# Patient Record
Sex: Female | Born: 1935 | Race: White | Hispanic: No | Marital: Married | Smoking: Never smoker
Health system: Southern US, Community
[De-identification: ages and names within clinical notes are randomized; demographics above are authoritative.]

## PROBLEM LIST (undated history)

## (undated) DIAGNOSIS — G5600 Carpal tunnel syndrome, unspecified upper limb: Secondary | ICD-10-CM

## (undated) DIAGNOSIS — R9389 Abnormal findings on diagnostic imaging of other specified body structures: Secondary | ICD-10-CM

## (undated) DIAGNOSIS — D649 Anemia, unspecified: Secondary | ICD-10-CM

## (undated) DIAGNOSIS — N189 Chronic kidney disease, unspecified: Secondary | ICD-10-CM

## (undated) DIAGNOSIS — Z87898 Personal history of other specified conditions: Secondary | ICD-10-CM

## (undated) DIAGNOSIS — E039 Hypothyroidism, unspecified: Secondary | ICD-10-CM

## (undated) DIAGNOSIS — I4891 Unspecified atrial fibrillation: Secondary | ICD-10-CM

## (undated) DIAGNOSIS — M5030 Other cervical disc degeneration, unspecified cervical region: Secondary | ICD-10-CM

## (undated) DIAGNOSIS — T464X5A Adverse effect of angiotensin-converting-enzyme inhibitors, initial encounter: Secondary | ICD-10-CM

## (undated) DIAGNOSIS — E785 Hyperlipidemia, unspecified: Secondary | ICD-10-CM

## (undated) DIAGNOSIS — I358 Other nonrheumatic aortic valve disorders: Secondary | ICD-10-CM

## (undated) DIAGNOSIS — I495 Sick sinus syndrome: Secondary | ICD-10-CM

## (undated) DIAGNOSIS — X58XXXA Exposure to other specified factors, initial encounter: Secondary | ICD-10-CM

## (undated) DIAGNOSIS — R05 Cough: Secondary | ICD-10-CM

## (undated) DIAGNOSIS — I340 Nonrheumatic mitral (valve) insufficiency: Secondary | ICD-10-CM

## (undated) DIAGNOSIS — R058 Other specified cough: Secondary | ICD-10-CM

## (undated) DIAGNOSIS — I779 Disorder of arteries and arterioles, unspecified: Secondary | ICD-10-CM

## (undated) DIAGNOSIS — R609 Edema, unspecified: Secondary | ICD-10-CM

## (undated) DIAGNOSIS — I739 Peripheral vascular disease, unspecified: Secondary | ICD-10-CM

## (undated) DIAGNOSIS — Z950 Presence of cardiac pacemaker: Secondary | ICD-10-CM

## (undated) DIAGNOSIS — I272 Pulmonary hypertension, unspecified: Secondary | ICD-10-CM

## (undated) DIAGNOSIS — R42 Dizziness and giddiness: Secondary | ICD-10-CM

## (undated) DIAGNOSIS — I10 Essential (primary) hypertension: Secondary | ICD-10-CM

## (undated) DIAGNOSIS — I639 Cerebral infarction, unspecified: Secondary | ICD-10-CM

## (undated) DIAGNOSIS — R55 Syncope and collapse: Secondary | ICD-10-CM

## (undated) DIAGNOSIS — R9430 Abnormal result of cardiovascular function study, unspecified: Secondary | ICD-10-CM

## (undated) DIAGNOSIS — I2510 Atherosclerotic heart disease of native coronary artery without angina pectoris: Secondary | ICD-10-CM

## (undated) DIAGNOSIS — Z7901 Long term (current) use of anticoagulants: Secondary | ICD-10-CM

## (undated) DIAGNOSIS — N2889 Other specified disorders of kidney and ureter: Secondary | ICD-10-CM

## (undated) HISTORY — DX: Unspecified atrial fibrillation: I48.91

## (undated) HISTORY — DX: Pulmonary hypertension, unspecified: I27.20

## (undated) HISTORY — DX: Cough: R05

## (undated) HISTORY — DX: Chronic kidney disease, unspecified: N18.9

## (undated) HISTORY — DX: Hypothyroidism, unspecified: E03.9

## (undated) HISTORY — DX: Personal history of other specified conditions: Z87.898

## (undated) HISTORY — DX: Abnormal findings on diagnostic imaging of other specified body structures: R93.89

## (undated) HISTORY — DX: Long term (current) use of anticoagulants: Z79.01

## (undated) HISTORY — DX: Essential (primary) hypertension: I10

## (undated) HISTORY — DX: Atherosclerotic heart disease of native coronary artery without angina pectoris: I25.10

## (undated) HISTORY — DX: Exposure to other specified factors, initial encounter: X58.XXXA

## (undated) HISTORY — DX: Hyperlipidemia, unspecified: E78.5

## (undated) HISTORY — DX: Presence of cardiac pacemaker: Z95.0

## (undated) HISTORY — DX: Peripheral vascular disease, unspecified: I73.9

## (undated) HISTORY — DX: Other specified disorders of kidney and ureter: N28.89

## (undated) HISTORY — DX: Other nonrheumatic aortic valve disorders: I35.8

## (undated) HISTORY — DX: Other specified cough: R05.8

## (undated) HISTORY — DX: Disorder of arteries and arterioles, unspecified: I77.9

## (undated) HISTORY — DX: Carpal tunnel syndrome, unspecified upper limb: G56.00

## (undated) HISTORY — PX: PACEMAKER INSERTION: SHX728

## (undated) HISTORY — DX: Syncope and collapse: R55

## (undated) HISTORY — DX: Dizziness and giddiness: R42

## (undated) HISTORY — DX: Adverse effect of angiotensin-converting-enzyme inhibitors, initial encounter: T46.4X5A

## (undated) HISTORY — DX: Cerebral infarction, unspecified: I63.9

## (undated) HISTORY — DX: Other cervical disc degeneration, unspecified cervical region: M50.30

## (undated) HISTORY — DX: Edema, unspecified: R60.9

## (undated) HISTORY — DX: Anemia, unspecified: D64.9

## (undated) HISTORY — DX: Sick sinus syndrome: I49.5

## (undated) HISTORY — DX: Abnormal result of cardiovascular function study, unspecified: R94.30

## (undated) HISTORY — DX: Nonrheumatic mitral (valve) insufficiency: I34.0

---

## 2007-10-01 ENCOUNTER — Ambulatory Visit: Payer: Self-pay | Admitting: Cardiology

## 2007-11-26 ENCOUNTER — Ambulatory Visit: Payer: Self-pay

## 2008-01-22 ENCOUNTER — Ambulatory Visit: Payer: Self-pay | Admitting: Cardiology

## 2008-01-31 ENCOUNTER — Ambulatory Visit: Payer: Self-pay | Admitting: Cardiology

## 2008-03-04 ENCOUNTER — Ambulatory Visit: Payer: Self-pay | Admitting: Cardiology

## 2008-04-11 ENCOUNTER — Ambulatory Visit: Payer: Self-pay | Admitting: Cardiology

## 2008-04-15 ENCOUNTER — Ambulatory Visit: Payer: Self-pay | Admitting: Cardiology

## 2008-08-20 ENCOUNTER — Encounter: Payer: Self-pay | Admitting: Cardiology

## 2008-08-23 ENCOUNTER — Encounter: Payer: Self-pay | Admitting: Cardiology

## 2008-08-25 ENCOUNTER — Ambulatory Visit: Payer: Self-pay | Admitting: Cardiology

## 2008-09-02 ENCOUNTER — Encounter: Payer: Self-pay | Admitting: Cardiology

## 2008-09-02 ENCOUNTER — Ambulatory Visit: Payer: Self-pay | Admitting: Cardiology

## 2008-09-15 ENCOUNTER — Ambulatory Visit: Payer: Self-pay | Admitting: Cardiology

## 2009-01-09 ENCOUNTER — Encounter: Payer: Self-pay | Admitting: Cardiology

## 2009-02-20 ENCOUNTER — Ambulatory Visit: Payer: Self-pay | Admitting: Cardiology

## 2009-03-09 ENCOUNTER — Encounter: Payer: Self-pay | Admitting: Cardiology

## 2009-03-09 ENCOUNTER — Encounter: Payer: Self-pay | Admitting: Physician Assistant

## 2009-03-09 ENCOUNTER — Ambulatory Visit: Payer: Self-pay | Admitting: Cardiology

## 2009-03-30 ENCOUNTER — Ambulatory Visit: Payer: Self-pay | Admitting: Internal Medicine

## 2009-03-31 ENCOUNTER — Telehealth: Payer: Self-pay | Admitting: Cardiology

## 2009-05-22 ENCOUNTER — Encounter: Payer: Self-pay | Admitting: Cardiology

## 2009-05-26 ENCOUNTER — Ambulatory Visit: Payer: Self-pay | Admitting: Cardiology

## 2009-06-12 ENCOUNTER — Encounter: Payer: Self-pay | Admitting: Cardiology

## 2009-07-06 ENCOUNTER — Encounter: Payer: Self-pay | Admitting: Cardiology

## 2009-08-18 ENCOUNTER — Encounter: Payer: Self-pay | Admitting: Cardiology

## 2009-09-17 ENCOUNTER — Ambulatory Visit: Payer: Self-pay | Admitting: Cardiology

## 2009-09-18 ENCOUNTER — Telehealth (INDEPENDENT_AMBULATORY_CARE_PROVIDER_SITE_OTHER): Payer: Self-pay | Admitting: *Deleted

## 2009-10-05 ENCOUNTER — Encounter: Payer: Self-pay | Admitting: Cardiology

## 2009-10-22 ENCOUNTER — Ambulatory Visit: Payer: Self-pay | Admitting: Cardiology

## 2009-12-03 ENCOUNTER — Encounter: Payer: Self-pay | Admitting: Cardiology

## 2009-12-03 ENCOUNTER — Ambulatory Visit: Payer: Self-pay | Admitting: Cardiology

## 2009-12-04 ENCOUNTER — Encounter: Payer: Self-pay | Admitting: Cardiology

## 2009-12-05 ENCOUNTER — Encounter: Payer: Self-pay | Admitting: Cardiology

## 2009-12-07 ENCOUNTER — Encounter: Payer: Self-pay | Admitting: Cardiology

## 2009-12-08 ENCOUNTER — Encounter: Payer: Self-pay | Admitting: Cardiology

## 2009-12-11 ENCOUNTER — Encounter: Payer: Self-pay | Admitting: Cardiology

## 2009-12-14 ENCOUNTER — Ambulatory Visit: Payer: Self-pay | Admitting: Physical Medicine & Rehabilitation

## 2009-12-14 ENCOUNTER — Inpatient Hospital Stay (HOSPITAL_COMMUNITY)
Admission: RE | Admit: 2009-12-14 | Discharge: 2009-12-30 | Payer: Self-pay | Admitting: Physical Medicine & Rehabilitation

## 2010-02-01 ENCOUNTER — Encounter: Payer: Self-pay | Admitting: Cardiology

## 2010-02-03 ENCOUNTER — Encounter (INDEPENDENT_AMBULATORY_CARE_PROVIDER_SITE_OTHER): Payer: Self-pay | Admitting: *Deleted

## 2010-02-09 ENCOUNTER — Encounter: Payer: Self-pay | Admitting: Cardiology

## 2010-02-11 ENCOUNTER — Telehealth (INDEPENDENT_AMBULATORY_CARE_PROVIDER_SITE_OTHER): Payer: Self-pay | Admitting: *Deleted

## 2010-02-16 ENCOUNTER — Ambulatory Visit: Payer: Self-pay | Admitting: Cardiology

## 2010-02-18 ENCOUNTER — Encounter: Payer: Self-pay | Admitting: Cardiology

## 2010-02-22 ENCOUNTER — Telehealth (INDEPENDENT_AMBULATORY_CARE_PROVIDER_SITE_OTHER): Payer: Self-pay | Admitting: *Deleted

## 2010-02-23 ENCOUNTER — Encounter: Payer: Self-pay | Admitting: Cardiology

## 2010-02-24 ENCOUNTER — Encounter: Payer: Self-pay | Admitting: Cardiology

## 2010-02-24 ENCOUNTER — Telehealth: Payer: Self-pay | Admitting: Cardiology

## 2010-02-26 ENCOUNTER — Ambulatory Visit: Payer: Self-pay | Admitting: Cardiology

## 2010-03-08 ENCOUNTER — Encounter: Payer: Self-pay | Admitting: Cardiology

## 2010-03-08 ENCOUNTER — Telehealth: Payer: Self-pay | Admitting: Cardiology

## 2010-03-08 LAB — CONVERTED CEMR LAB: INR: 3.3

## 2010-03-09 ENCOUNTER — Ambulatory Visit: Payer: Self-pay | Admitting: Cardiology

## 2010-03-18 ENCOUNTER — Encounter: Payer: Self-pay | Admitting: Cardiology

## 2010-03-19 ENCOUNTER — Telehealth: Payer: Self-pay | Admitting: Cardiology

## 2010-03-19 ENCOUNTER — Encounter: Payer: Self-pay | Admitting: Cardiology

## 2010-03-19 LAB — CONVERTED CEMR LAB
INR: 2.1
Prothrombin Time: 21.7 s

## 2010-03-22 ENCOUNTER — Encounter: Payer: Self-pay | Admitting: Cardiology

## 2010-03-25 ENCOUNTER — Telehealth (INDEPENDENT_AMBULATORY_CARE_PROVIDER_SITE_OTHER): Payer: Self-pay | Admitting: *Deleted

## 2010-03-25 ENCOUNTER — Encounter: Payer: Self-pay | Admitting: Cardiology

## 2010-03-31 ENCOUNTER — Encounter: Payer: Self-pay | Admitting: Cardiology

## 2010-04-05 ENCOUNTER — Encounter: Payer: Self-pay | Admitting: Cardiology

## 2010-04-06 ENCOUNTER — Telehealth (INDEPENDENT_AMBULATORY_CARE_PROVIDER_SITE_OTHER): Payer: Self-pay | Admitting: *Deleted

## 2010-04-07 ENCOUNTER — Encounter: Payer: Self-pay | Admitting: Cardiology

## 2010-04-14 ENCOUNTER — Encounter: Payer: Self-pay | Admitting: Cardiology

## 2010-04-20 ENCOUNTER — Encounter: Payer: Self-pay | Admitting: Cardiology

## 2010-04-21 ENCOUNTER — Ambulatory Visit: Payer: Self-pay | Admitting: Cardiology

## 2010-05-06 ENCOUNTER — Ambulatory Visit: Payer: Self-pay | Admitting: Cardiology

## 2010-05-10 ENCOUNTER — Inpatient Hospital Stay (HOSPITAL_COMMUNITY): Admission: AD | Admit: 2010-05-10 | Discharge: 2010-05-13 | Payer: Self-pay | Admitting: Internal Medicine

## 2010-05-10 ENCOUNTER — Encounter: Payer: Self-pay | Admitting: Cardiology

## 2010-05-10 ENCOUNTER — Ambulatory Visit: Payer: Self-pay | Admitting: Internal Medicine

## 2010-05-11 ENCOUNTER — Encounter: Payer: Self-pay | Admitting: Cardiology

## 2010-05-11 ENCOUNTER — Encounter: Payer: Self-pay | Admitting: Internal Medicine

## 2010-05-12 ENCOUNTER — Encounter: Payer: Self-pay | Admitting: Cardiology

## 2010-05-18 ENCOUNTER — Telehealth (INDEPENDENT_AMBULATORY_CARE_PROVIDER_SITE_OTHER): Payer: Self-pay | Admitting: *Deleted

## 2010-05-24 ENCOUNTER — Encounter: Payer: Self-pay | Admitting: Internal Medicine

## 2010-05-28 ENCOUNTER — Ambulatory Visit: Payer: Self-pay | Admitting: Internal Medicine

## 2010-06-10 ENCOUNTER — Encounter: Payer: Self-pay | Admitting: Cardiology

## 2010-06-11 ENCOUNTER — Ambulatory Visit: Payer: Self-pay | Admitting: Cardiology

## 2010-06-15 ENCOUNTER — Encounter (INDEPENDENT_AMBULATORY_CARE_PROVIDER_SITE_OTHER): Payer: Self-pay | Admitting: *Deleted

## 2010-07-28 ENCOUNTER — Encounter: Payer: Self-pay | Admitting: Cardiology

## 2010-08-13 ENCOUNTER — Encounter: Payer: Self-pay | Admitting: Physician Assistant

## 2010-08-13 ENCOUNTER — Ambulatory Visit: Payer: Self-pay | Admitting: Cardiology

## 2010-09-28 ENCOUNTER — Ambulatory Visit: Payer: Self-pay | Admitting: Internal Medicine

## 2010-10-13 ENCOUNTER — Encounter (INDEPENDENT_AMBULATORY_CARE_PROVIDER_SITE_OTHER): Payer: Self-pay | Admitting: *Deleted

## 2010-11-04 ENCOUNTER — Encounter: Payer: Self-pay | Admitting: Cardiology

## 2010-11-07 ENCOUNTER — Encounter: Payer: Self-pay | Admitting: Physical Medicine & Rehabilitation

## 2010-11-09 ENCOUNTER — Encounter (INDEPENDENT_AMBULATORY_CARE_PROVIDER_SITE_OTHER): Payer: Self-pay | Admitting: *Deleted

## 2010-11-16 NOTE — Progress Notes (Signed)
Summary: DAUGHTER REQUEST EARLIER APPT  Phone Note Call from Patient Call back at [phone]   Caller: Daughter(Cindy Priddy) Call For: nurse Summary of Call: Patient has appointment next Tuesday with MD,daughter called concerned that patient may need to be seen earlier since she has been coughing,bloated and Vyas ordered CXR and per Daughter, that revealed Mild CHF. Patient is currently being tx with Lasix. Patient is a resident at [facility]. Initial call taken by: Carlye Grippe,  February 11, 2010 3:47 PM  Follow-up for Phone Call        She will need to see Dr. Sherril Croon if she needs to be seen before next Tuesday.  Additional Follow-up for Phone Call Additional follow up Details #1::        Patient's daughter informed of the above.  Additional Follow-up by: Carlye Grippe,  February 12, 2010 3:05 PM

## 2010-11-16 NOTE — Assessment & Plan Note (Signed)
Summary: 2 WK F/U PER REMINDER-JM   Visit Type:  Follow-up Primary Provider:  Dhruv Vyas,MD  CC:  atrial fibrillation.  History of Present Illness: The patient is seen for followup of atrial fibrillation and fluid overload.  She also had a cough.  She has hypertension.  When I saw her last decision was made to digoxin to her meds.  This had a definite positive effect in controlling her heart rate.  Her atrial fib rate is controlled today.  She has not been having chest pain.  She's not having palpitations.  Her volume status is relatively stable.  The patient got up in the middle of the night from her hospital bed to try to help her husband who had fallen out of his regular bed.  She tripped over the sheets and her husband was able to get up but not help her up.  Eventually friends came and helped her up.  She tolerated this episode cardiovascular viewpoint.  Current Medications (verified): 1)  Nitroglycerin 0.4 Mg Subl (Nitroglycerin) .... Place 1 Tablet Under Tongue As Directed 2)  Synthroid 88 Mcg Tabs (Levothyroxine Sodium) .Marland Kitchen.. 1 By Mouth Once Daily 3)  Metoprolol Tartrate 25 Mg Tabs (Metoprolol Tartrate) .... Take 1 Tablet By Mouth Once A Day (Hold For Hr Less Than 60) 4)  Cardizem Cd 360 Mg Xr24h-Cap (Diltiazem Hcl Coated Beads) .... Take 1 Tablet By Mouth Once A Day 5)  Furosemide 40 Mg Tabs (Furosemide) .... Take One Tablet By Mouth Daily As Directed 6)  Vitamin D3 1000 Unit Caps (Cholecalciferol) .... Take 1 Tablet By Mouth Once A Day 7)  Klor-Con M10 10 Meq Cr-Tabs (Potassium Chloride Crys Cr) .... Take One By Mouth Every  Day With Lasix 8)  Coumadin 3 Mg Tabs (Warfarin Sodium) .... Take 1 Tablet By Mouth Once A Day 9)  Cardura 2 Mg Tabs (Doxazosin Mesylate) .... Take 1 Tablet By Mouth Once A Day At Bedtime 10)  Tylenol 325 Mg Tabs (Acetaminophen) .... Take One-Two By Mouth Every 4hours As Needed Pain 11)  Dulcolax 10 Mg Supp (Bisacodyl) .... Take 1 Tablet By Mouth Once A Day At  Bedtime As Needed Constipation 12)  Calcium-Magnesium-Zinc 333-133-8.3 Mg Tabs (Calcium-Magnesium-Zinc) .... Take 1 Tablet By Mouth Two Times A Day 13)  Lanoxin 0.125 Mg Tabs (Digoxin) .... Take 1/2 Tablet By Mouth  Daily. 14)  Zyrtec Allergy 10 Mg Tabs (Cetirizine Hcl) .... Take 1 Tablet By Mouth Once A Day 15)  Hematinic/folic Acid 324-1 Mg Tabs (Ferrous Fumarate-Folic Acid) .... Take 1 Tablet By Mouth Once A Day 16)  Folic Acid 400 Mcg Tabs (Folic Acid) .... Take 1 Tablet By Mouth Once A Day 17)  Fish Oil 1200 Mg Caps (Omega-3 Fatty Acids) .... Take 1 Tablet By Mouth Two Times A Day 18)  Sam-E Complete 400 Mg Tbec (S-Adenosylmethionine) .... Take 1 Tablet By Mouth Once A Day 19)  Flax Seed Oil 1000 Mg Caps (Flaxseed (Linseed)) .... Take 1 Tablet By Mouth Two Times A Day 20)  Coq10 100 Mg Caps (Coenzyme Q10) .... Take 1 Tablet By Mouth Once A Day 21)  Vitamin C 500 Mg Tabs (Ascorbic Acid) .... Take 1 Tablet By Mouth Two Times A Day 22)  Vitamin E 400 Unit Caps (Vitamin E) .... Take 1 Tablet By Mouth Once A Day 23)  B-Caro-T 15 Mg Caps (Beta Carotene) .... Take 1 Tablet By Mouth Once A Day 24)  Zinc Gluconate 30 Mg Tabs (Zinc Gluconate) .... Take 1 Tablet  By Mouth Once A Day  Allergies (verified): 1)  ! Darvocet 2)  ! Prednisone 3)  ! * Statins 4)  ! Lisinopril  Comments:  Nurse/Medical Assistant: The patient's medications and allergies were reviewed with the patient and were updated in the Medication and Allergy Lists. List reviewed.   Past History:  Past Medical History: LV  good 1. Coronary artery disease with an intervention in 2007, in the     setting of a myocardial infarction.   2. Carotid artery disease. 50% R. ICA stenosis, 50-70% LICA stenosis(November 2009) no significant change  /  Doppler December, 2010... less than 50% bilaterally.. further data will be needed to compare to prior studies 3. Peripheral arterial disease of the lower extremity. right renal mass that was  biopsied and worked up in the past. 5. Dyslipidemia. 6. Hypertension treated. 7. Hypothyroidism treated. normocytic anemia. atrial fibrillation..Dr. Ladona Ridgel 03/2008 felt symptoms could be controlled with as needed beta-blocker and follow cataract surgery. /    digoxin started Feb 26, 2010 for elevated heart rate  /  rate controlled Mar 09, 2010 carpal tunnel and C-section. vertigo in the past. cervical degenerative disease. syncopal episode that is not yet completely explained.  It is possible that this could have been vasovagal.  Ejection fraction 60-65%(September 02, 2008) Mild-to-moderate left atrial dilatation Mild-to-moderate mitral regurgitation Aortic valve thickening but no aortic stenosis Mild tricuspid regurgitation Sinus bradycardia on Holter (November 2009) CVA.... while in the hospital February, 2011. Cough    May, 2011 ACE Inhibitor cough...results completely as of Mar 09, 2010 Edema    Review of Systems       Patient denies fever, chills, headache, sweats, rash, change in vision, change in hearing, chest pain, cough, shortness of breath, nausea vomiting, urinary symptoms.  She has some areas of ecchymoses from her husband's attempt to lift her from the floor.  Her shoulder is sore.  All other systems are reviewed and are negative.  Vital Signs:  Patient profile:   77 year old female Height:      62 inches Weight:      140 pounds Pulse rate:   120 / minute BP sitting:   154 / 98  (right arm) Cuff size:   regular  Vitals Entered By: Carlye Grippe (Mar 09, 2010 2:38 PM)  Physical Exam  General:  patient is stable in general. Head:  head is atraumatic. Eyes:  no xanthelasma. Neck:  no carotid bruits. Chest Wall:  no chest wall tenderness. Lungs:  lungs are clear. respiratory effort is not labored. Heart:  cardiac exam reveals an S1-S2.  Rate is controlled.  The rhythm is irregularly irregular Abdomen:  abdomen is soft. Msk:  no musculoskeletal deformities.   There is some soreness in her left shoulder. Extremities:  there is 1+ peripheral edema. Skin:  patient has some ecchymoses on her left arm. Psych:  patient is oriented to person time and place.  Affect is normal.   Impression & Recommendations:  Problem # 1:  RENAL INSUFFICIENCY (ICD-588.9)  I'm still looking for followup labs that were to have been drawn.  We will be sure that these are found.  Orders: T-Basic Metabolic Panel [id number]) T-TSH [id number]) T-Digoxin [phone])  Problem # 2:  COUGH (ICD-786.2) The cough was related to ACE inhibitor.  We will not use any significant period  Problem # 3:  EDEMA (ICD-782.3) Patient has some mild persistent edema.  I will not be pushing her diuretics and he ordered this point.  I still need more information about her renal function.  Problem # 4:  CAD (ICD-414.00)  Coronary disease is stable.  No change in therapy.  Orders: Home Health Referral [facility])  Problem # 5:  HYPERTENSION (ICD-401.9)  Blood pressure is elevated today.  Her pressure has been checked by her nurses but we do not have this data.  I will not change her meds yet.  Orders: T-Basic Metabolic Panel [id number]) T-TSH [phone]) T-Digoxin [phone]) Home Health Referral (Home Health)  Problem # 6:  HYPOTHYROIDISM (ICD-244.9)  I'm still looking for followup labs concerning her thyroid  Orders: T-Basic Metabolic Panel [id number]) T-TSH [id number]) T-Digoxin [phone])  Problem # 7:  ATRIAL FIBRILLATION (ICD-427.31)  Her updated medication list for this problem includes:    Metoprolol Tartrate 25 Mg Tabs (Metoprolol tartrate) .Marland Kitchen... Take 1 tablet by mouth once a day (hold for hr less than 60)    Coumadin 3 Mg Tabs (Warfarin sodium) .Marland Kitchen... Take 1 tablet by mouth once a day    Lanoxin 0.125 Mg Tabs (Digoxin) .Marland Kitchen... Take 1/2 tablet by mouth  daily.  Orders: EKG w/ Interpretation (93000) T-Basic Metabolic Panel ([id number]) T-TSH  [id number]) T-Digoxin [phone]) EKG is done today reviewed by me.  Atrial fib persists.  Her rate is under much better control.  Will obtain a digoxin level.  Patient Instructions: 1)  Your physician recommends that you go to the [facility] for lab work: HAVE HOME HEALTH DO LABS AND FAX TO OUR OFFICE. 2)  Have home health send [place] your vital sign readings. 3)  RETURN FOR AN OFFICE VISIT WITH DR. KATZ ON WEDNESDAY, JULY 6TH AT 1:30PM.  Appended Document: 2 WK F/U PER REMINDER-JM good

## 2010-11-16 NOTE — Progress Notes (Signed)
Summary: Admitted to [facility]-questions regarding Coumadin/EP  Phone Note From Other Clinic   Summary of Call: Dr. Kerby Nora with [facility] called regarding pt. She was transferred to [facility] following a fall and ruptured globe. Pt's coumadin was stopped for surgery. The cardiology team decided not to restart coumadin at this point. They will let Dr. Myrtis Ser determine if this needs to be restarted at [facility] on July 6th.   Also, they would like Dr. Myrtis Ser to determine if pt needs evaluation with Holter monitor. Per Dr. Kerby Nora, Pt had AFIB with HR's 130-140's during hospitalization. Pt also had episodes of bradycardia. They question if this is d/t meds of possibly tachy/brady syndrome. Pt was evaluated by EP during hospitalizaton at [facility].   Dr. Kerby Nora will have d/c summary and EP evaluation sent to our office for Dr. Henrietta Hoover review.   Initial call taken by: Cyril Loosen, RN, BSN,  April 06, 2010 10:36 AM

## 2010-11-16 NOTE — Assessment & Plan Note (Signed)
Summary: review meds, adjust fluid med  --agh   Visit Type:  Follow-up Primary Provider:  Dhruv Vyas,MD   History of Present Illness: The patient is seen for followup of atrial fibrillation and fluid overload.  I saw her last in the office on October 22, 2009.  She was stable at that time.  Since that time she has been admitted to the hospital with cough congestion and atrial fibrillation.  During that hospitalization an echo showed an ejection fraction of 60-65%.  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  Ultimately she finally agreed to Coumadin.  She had an ultrasound-guided thoracentesis that was successful.  She was then discharged to rehabilitation.  When she first left the hospital her weight was in the range of 122 pounds.  This was decreased from her admission weight.  Since that time her weight has climbed to be on her admission weight up to the range of 140 pounds.  She is short of breath.  She has a persistent dry cough.  She has edema.  Preventive Screening-Counseling & Management  Alcohol-Tobacco     Smoking Status: never  Current Medications (verified): 1)  Lisinopril 20 Mg Tabs (Lisinopril) .... Take 1 Tablet By Mouth Two Times A Day 2)  Nitroglycerin 0.4 Mg Subl (Nitroglycerin) .... Place 1 Tablet Under Tongue As Directed 3)  Synthroid 88 Mcg Tabs (Levothyroxine Sodium) .Marland Kitchen.. 1 By Mouth Once Daily 4)  Metoprolol Tartrate 25 Mg Tabs (Metoprolol Tartrate) .... Take 1 Tablet By Mouth Once A Day (Hold For Hr Less Than 60) 5)  Cardizem Cd 360 Mg Xr24h-Cap (Diltiazem Hcl Coated Beads) .... Take 1 Tablet By Mouth Once A Day 6)  Aspirin 81 Mg Tbec (Aspirin) .... Take One Tablet By Mouth Daily 7)  Furosemide 20 Mg Tabs (Furosemide) .... Take One Tablet By Mouth Daily 8)  Multivitamins  Tabs (Multiple Vitamin) .... Take 1 Tablet By Mouth Once A Day 9)  Vitamin D3 1000 Unit Caps (Cholecalciferol) .... Take 1 Tablet By  Mouth Once A Day 10)  Allegra 180 Mg Tabs (Fexofenadine Hcl) .... Take 1 Tablet By Mouth Once A Day 11)  Klor-Con M10 10 Meq Cr-Tabs (Potassium Chloride Crys Cr) .... Take One By Mouth Every Other Day 12)  Coumadin 3 Mg Tabs (Warfarin Sodium) .... Take 1 Tablet By Mouth Once A Day 13)  Cardura 2 Mg Tabs (Doxazosin Mesylate) .... Take 1 Tablet By Mouth Once A Day At Bedtime 14)  Tylenol 325 Mg Tabs (Acetaminophen) .... Take One-Two By Mouth Every 4hours As Needed Pain 15)  Dulcolax 10 Mg Supp (Bisacodyl) .... Take 1 Tablet By Mouth Once A Day At Bedtime As Needed Constipation 16)  Oscal 500/200 D-3 500-200 Mg-Unit Tabs (Calcium-Vitamin D) .... Take 1 Tablet By Mouth Two Times A Day  Allergies (verified): 1)  ! Darvocet 2)  ! Prednisone 3)  ! * Statins  Comments:  Nurse/Medical Assistant: The patient's medications and allergies were reviewed with the patient and were updated in the Medication and Allergy Lists. List reviewed.  Past History:  Past Medical History: LV  good 1. Coronary artery disease with an intervention in 2007, in the     setting of a myocardial infarction.   2. Carotid artery disease. 50% R. ICA stenosis, 50-70% LICA stenosis(November 2009) no significant change  /  Doppler December, 2010... less than 50% bilaterally.. further data will be needed to compare to prior studies 3. Peripheral arterial disease of  the lower extremity. right renal mass that was biopsied and worked up in the past. 5. Dyslipidemia. 6. Hypertension treated. 7. Hypothyroidism treated. normocytic anemia. atrial fibrillation..Dr. Ladona Ridgel 03/2008 felt symptoms could be controlled with as needed beta-blocker and follow cataract surgery.  She hopes to have another cataract operation soon, but I will put this on hold. carpal tunnel and C-section. vertigo in the past. cervical degenerative disease. syncopal episode that is not yet completely explained.  It is possible that this could have been  vasovagal.  Ejection fraction 60-65%(September 02, 2008) Mild-to-moderate left atrial dilatation Mild-to-moderate mitral regurgitation Aortic valve thickening but no aortic stenosis Mild tricuspid regurgitation Sinus bradycardia on Holter (November 2009) CVA.... while in the hospital February, 2011.    Review of Systems       Patient denies fever, chills, headache, sweats, rash, change in vision, change in hearing.  She has a cough as outlined and she has shortness of breath.  She has no urinary symptoms.  All the systems are reviewed and are negative  Vital Signs:  Patient profile:   77 year old female Height:      62 inches Weight:      143 pounds O2 Sat:      97 % Pulse rate:   120 / minute BP sitting:   135 / 87  (left arm) Cuff size:   regular  Vitals Entered By: Carlye Grippe (Feb 16, 2010 2:55 PM)   Physical Exam  General:  in general the patient is stable although she has a persistent cough. Head:  head is atraumatic. Eyes:  no xanthelasma. Neck:  no carotid bruits. Chest Wall:  no chest wall tenderness. Lungs:  lungs reveal scattered rhonchi. Heart:  cardiac exam reveals S1 and S2.  There is a systolic murmur. Abdomen:  abdomen is soft. Msk:  no musculoskeletal deformities. Extremities:  2+ peripheral edema. Skin:  patient has areas of ecchymoses from her aspirin and Coumadin. Psych:  patient is oriented to person time and place.  Affect is normal.  She is here with her daughter.   Impression & Recommendations:  Problem # 1:  EDEMA (ICD-782.3)  Patient has significant edema.  She is watching her salt and fluid intake.  We will increase her diuretic dose and provide early followup.  Orders: T-Basic Metabolic Panel [id number])  Problem # 2:  CAD (ICD-414.00) Coronary disease is stable.  No further workup.  Problem # 3:  HYPERTENSION (ICD-401.9)  Blood pressure is under reasonable control.  I will not be adding any additional meds.  Lisinopril will be  put on hold for her cough.  We'll follow her blood pressure carefully.  Orders: T-Basic Metabolic Panel [id number])  Problem # 4:  HYPOTHYROIDISM (ICD-244.9)  Her updated medication list for this problem includes:    Synthroid 88 Mcg Tabs (Levothyroxine sodium) .Marland Kitchen... 1 by mouth once daily The patient will need followup thyroid functions in the future.  Problem # 5:  ATRIAL FIBRILLATION (ICD-427.31) The patient's atrial fibrillation increased.  She has normal LV function on hesitant to push her Cardizem to a higher dose.  After her potassium and renal function rechecked digoxin will be added.  Problem # 6:  COUGH (ICD-786.2) I suspect that the cough is a combination of locking overload and also from her ACE inhibitor.  She is on a higher dose than she had been on in the past.  On the short-term we will stop her lisinopril.  She is to be diuresed and we'll see  her back for follow  Other Orders: EKG w/ Interpretation (93000)  Patient Instructions: 1)  Have lab work drawn ASAP. Once this is reviewed we will notify you if you can start Digoxin. 2)  If you start Digoxin, you will have 0.125mg  tablets. Take 2 tablets once daily for 3 days and then 1 tablet daily. 3)  Increase Lasix (furosemide) to 80mg  tonight and tomorrow (02/16/10) and then take 40mg  by mouth by mouth once daily.  A new prescription for 40mg  tablets has been sent to [facility] in [place]. 4)  Stop Aspirin. 5)  Stop Lisinopril. 6)  Return for Coumadin Clinic on Tuesday, May 10th at 1:30pm. 7)  We will call you regarding an appt with Dr. Myrtis Ser next week. Prescriptions: FUROSEMIDE 40 MG TABS (FUROSEMIDE) Take one tablet by mouth daily as directed  #30 x 6   Entered by:   Cyril Loosen, RN, BSN   Authorized by:   Talitha Givens, MD, [facility]   Signed by:   Cyril Loosen, RN, BSN on 02/16/2010   Method used:   Electronically to        [facility] (retail)       [address]       [place],  [place]  [address]       Ph: [phone]       Fax: [phone]   RxID:   [id number]

## 2010-11-16 NOTE — Assessment & Plan Note (Signed)
Summary: 5 wk fu -srs   Visit Type:  Follow-up Primary Provider:  Dhruv Vyas,MD  CC:  edema.  History of Present Illness: The patient is seen for followup of fluid overload along with coronary artery disease.  I saw her last September 17 2009.  At that time we talked about decreasing her fluid intake.  She has done that and she definitely has improvement in her edema.  She takes Lasix on a p.r.n. basis.  We had a full discussion about this.  She does not have any palpitations.  She is fully active.  She has some mild shortness of breath if she climbs the stairs rapidly.  She had followup carotid Dopplers done on October 05, 2009.  These show only minor disease.  No further workup is needed.  Preventive Screening-Counseling & Management  Alcohol-Tobacco     Smoking Status: never  Current Medications (verified): 1)  Lisinopril 10 Mg Tabs (Lisinopril) .... Take 1 Tablet By Mouth Two Times A Day 2)  Nitroglycerin 0.4 Mg Subl (Nitroglycerin) .... Place 1 Tablet Under Tongue As Directed 3)  Synthroid 88 Mcg Tabs (Levothyroxine Sodium) .Marland Kitchen.. 1 By Mouth Once Daily 4)  Metoprolol Tartrate 25 Mg Tabs (Metoprolol Tartrate) .... Take One Tablet As Needed For Palpitations, No More Than 2 Per Day 5)  Alendronate Sodium 70 Mg Tabs (Alendronate Sodium) .Marland Kitchen.. 1 By Mouth Qweek 6)  Carvedilol 12.5 Mg Tabs (Carvedilol) .... Take One Tablet By Mouth Twice A Day 7)  Cardizem Cd 240 Mg Xr24h-Cap (Diltiazem Hcl Coated Beads) .... Take 1 Tablet By Mouth Once A Day 8)  Aspirin 81 Mg Tbec (Aspirin) .... Take One Tablet By Mouth Daily 9)  Cetirizine Hcl 10 Mg Tabs (Cetirizine Hcl) .Marland Kitchen.. 1 By Mouth Once Daily 10)  Folic Acid 400 Mcg Tabs (Folic Acid) .... Take 1 Tablet By Mouth Once A Day 11)  Fish Oil Double Strength 1200 Mg Caps (Omega-3 Fatty Acids) .... Take 1 Tablet By Mouth Two Times A Day 12)  Sam-G.l.a. 100-150-40 Mg Caps (Dha-Epa-Gla) .... Take 1 Tablet By Mouth Once A Day 13)  Flax   Oil (Flaxseed (Linseed))  .Marland Kitchen.. 1 By Mouth  Once Daily 14)  Coenzyme Q10 10 Mg Caps (Coenzyme Q10) .Marland Kitchen.. 1 By Mouth Once Daily 15)  Vitamin C 500 Mg  Tabs (Ascorbic Acid) .... Take 1 Tablet By Mouth Two Times A Day 16)  Vitamin E 600 Unit  Caps (Vitamin E) .Marland Kitchen.. 1 By Mouth Once Daily 17)  Beta Carotene 54098 Unit Caps (Beta Carotene) .Marland Kitchen.. 1 By Mouth Once Daily 18)  Zinc 100 Mg Tabs (Zinc) .... 1/4 Tab Daily 19)  Furosemide 20 Mg Tabs (Furosemide) .... Take One Tablet By Mouth As Needed 20)  Calcium Magnesium 600-100-300 Liqd (Calcium-Magnesium-Vitamin D) .... Take 1 Tablet By Mouth Once A Day  Allergies: 1)  ! Darvocet 2)  ! Prednisone 3)  ! * Statins  Comments:  Nurse/Medical Assistant: The patient's medications were reviewed with the patient and were updated in the Medication List. Pt brought medication list to office visit.  Past History:  Past Medical History: LV  good 1. Coronary artery disease with an intervention in 2007, in the     setting of a myocardial infarction.   2. Carotid artery disease. 50% R. ICA stenosis, 50-70% LICA stenosis(November 2009) no significant change  /  Doppler December, 2010... less than 50% bilaterally.. further data will be needed to compare to prior studies 3. Peripheral arterial disease of the lower extremity.  right renal mass that was biopsied and worked up in the past. 5. Dyslipidemia. 6. Hypertension treated. 7. Hypothyroidism treated. normocytic anemia. atrial fibrillation..Dr. Ladona Ridgel 03/2008 felt symptoms could be controlled with as needed beta-blocker and follow cataract surgery.  She hopes to have another cataract operation soon, but I will put this on hold. carpal tunnel and C-section. vertigo in the past. cervical degenerative disease. syncopal episode that is not yet completely explained.  It is possible that this could have been vasovagal.  Ejection fraction 60-65%(September 02, 2008) Mild-to-moderate left atrial dilatation Mild-to-moderate mitral  regurgitation Aortic valve thickening but no aortic stenosis Mild tricuspid regurgitation Sinus bradycardia on Holter (November 2009)    Review of Systems       Patient denies fever, chills, headache, sweats, rash, change in vision, change in hearing, chest pain, cough, nausea vomiting, urinary symptoms.  All other systems are reviewed and are negative.  Vital Signs:  Patient profile:   77 year old female Height:      62 inches Weight:      137 pounds BMI:     25.15 Pulse rate:   86 / minute BP sitting:   138 / 84  (left arm) Cuff size:   regular  Vitals Entered By: Cyril Loosen, RN, BSN (October 22, 2009 2:30 PM)  Nutrition Counseling: Patient's BMI is greater than 25 and therefore counseled on weight management options. CC: edema Comments Follow up visit. Pt states she still has some palpitations at night but she takes her pill and that helps.   Physical Exam  General:  in general the patient is stable. Head:  head is atraumatic. Eyes:  no xanthelasma. Neck:  no jugular venous distention. Chest Wall:  no chest wall tenderness. Lungs:  lungs are clear.  Respiratory effort is nonlabored. Heart:  cardiac exam reveals S1-S2.  There no clicks or significant murmurs. Abdomen:  abdomen soft. Msk:  no musculoskeletal deformities. Extremities:  mild ankle edema. Skin:  no skin rashes. Psych:  patient is oriented to person time and place.  Affect is normal.   Impression & Recommendations:  Problem # 1:  EDEMA (ICD-782.3) The patient has mild ankle edema today.  She uses Lasix on a p.r.n. basis.  We talked about being careful with salt and fluid intake.  If her weight remained stable and she continues to have a small amount of ankle edema she should proceed without other medications.  If she has worsening edema she should begin to take her low-dose furosemide.  She will be in touch if this does not work for her.  Problem # 2:  CAD (ICD-414.00) Coronary disease is stable.   EKG is done today and shows no significant change.  She has sinus rhythm.  EKG is reviewed by me.  Problem # 3:  CAROTID ARTERY DISEASE (ICD-433.10)  Her updated medication list for this problem includes:    Aspirin 81 Mg Tbec (Aspirin) .Marland Kitchen... Take one tablet by mouth daily Patient's most recent carotid Dopplers reveal only minimal disease.  This does not appear to fit with her prior studies.  Of course it is good that she is not having worsening disease.  Oral studies will have to be reviewed.  This tissue will be followed over time.  Problem # 4:  HYPERTENSION (ICD-401.9) blood pressure is under good control today.  No change in medicines.  Problem # 5:  ATRIAL FIBRILLATION (ICD-427.31) EKG reveals sinus rhythm.  No change in her meds.  See her back in  6 months in followup.    Other Orders: EKG w/ Interpretation (93000)  Patient Instructions: 1)  Your physician wants you to follow-up in: 6 MONTHS.  You will receive a reminder letter in the mail about two months in advance. If you don't receive a letter, please call our office to schedule the follow-up appointment. 2)  Your physician recommends that you continue on your current medications as directed. Please refer to the Current Medication list given to you today.

## 2010-11-16 NOTE — Letter (Signed)
Summary: Cardiac Cath Instructions - [facility]  [facility] [address]   [place], [place] [address]   Phone: [phone]  Fax: [phone]     02/03/2010 MRN: [id number]  [name] [address] [place], [place]  [address]  [name],   You are scheduled for a Cardiac Catheterization on Thursday, April 28 at 9:30 with Dr. Sanjuana Kava.    Please arrive to the 1st floor of the [facility] at [facility] at 9:30am / pm on the day of your procedure. Please do not arrive before 6:30 a.m. Call the [facility] at [phone] if you are unable to make your appointmnet. The Code to get into the parking garage under the building is 0001. Take the elevators to the 1st floor. You must have someone to drive you home. Someone must be with you for the first 24 hours after you arrive home. Please wear clothes that are easy to get on and off and wear slip-on shoes. Do not eat or drink after midnight except water with your medications that morning. Bring all your medications and current insurance cards with you.  __X_ DO NOT take these medications before your procedure:  Benazepril/HCTZ, Coumadin - holding 5 days prior to procedure, Metforming - hold 24 hours before & 48 hours after   ___ Make sure you take your aspirin.  _X__ You may take ALL of your medications with water that morning except those stated above.  ___ DO NOT take ANY medications before your procedure.  ___ Pre-med instructions:  ________________________________________________________________________  The usual length of stay after your procedure is 2 to 3 hours. This can vary.  If you have any questions, please call the office at the number listed above.  Hoover Brunette, LPN           Directions to the [facility]  Please Note : Park in [place] under the building not the parking deck.  From [employer]: Turn onto [address] Left onto [address] (1st stoplight) Right at the brick entrance to the hospital ([address]) Bear to the right and you will see a blue sign "[facility]" Parking garage is a sharp right'to get through the gate out in the code _______. Once you park, take the elevator to the first floor. Please do not arrive before 0630am. The building will be dark before that time.   From [address] Turn onto [address] Turn left into the brick entrance to the hospital ([address] circle [address]) Bear to the right and you will see a blue sign "[facility]" Parking garage is a sharp right, to get thru the gate put in the code ____. Once you park, take the elevator to the first floor. Please do not arrive before 0630am. The building will be dark before that time    Stop Coumadin on Saturday, April 23. Do labs at the [facility] on Thursday, April 21 (eary morning) then come by office to discuss cath instructions.   Coumadin check in office on Wednesday, April 27 at 9:15a.m.         Appended Document: Cardiac Cath Instructions - [facility] Above letter done in error.

## 2010-11-16 NOTE — Miscellaneous (Signed)
Summary: Device preload  Clinical Lists Changes  Observations: Added new observation of PPM INDICATN: Tachy-brady syndrome (05/24/2010 14:02) Added new observation of MAGNET RTE: BOL 85 ERI 65 (05/24/2010 14:02) Added new observation of PPMLEADSTAT1: active (05/24/2010 14:02) Added new observation of PPMLEADSER1: [id number] (05/24/2010 14:02) Added new observation of PPMLEADMOD1: [id number]  (05/24/2010 14:02) Added new observation of PPMLEADLOC1: RV  (05/24/2010 14:02) Added new observation of PPM IMP MD: Sherryl Manges, MD  (05/24/2010 14:02) Added new observation of PPMLEADDOI1: 05/10/2010  (05/24/2010 14:02) Added new observation of PPM DOI: 05/10/2010  (05/24/2010 14:02) Added new observation of PPM SERL#: [id number]  (05/24/2010 14:02) Added new observation of PPM MODL#: [id number]  (05/24/2010 82:95) Added new observation of PACEMAKERMFG: [employer]tronic  (05/24/2010 14:02) Added new observation of PACEMAKER MD: Sherryl Manges, MD  (05/24/2010 14:02)      PPM Specifications Following MD:  Sherryl Manges, MD     PPM Vendor:  Medtronic     PPM Model Number:  [id number]     PPM Serial Number:  [id number] PPM DOI:  05/10/2010     PPM Implanting MD:  Sherryl Manges, MD  Lead 1    Location: RV     DOI: 05/10/2010     Model #: [id number]     Serial #: [id number]     Status: active  Magnet Response Rate:  BOL 85 ERI 65  Indications:  Tachy-brady syndrome

## 2010-11-16 NOTE — Assessment & Plan Note (Signed)
Summary: [name]  --agh   Visit Type:  Follow-up Primary Provider:  Dhruv Vyas,MD   History of Present Illness: patient returns for scheduled followup, since her recent visit with Dr. Myrtis Ser.  Since that visit, she has had followup blood workup notable for mildly increased creatinine of 1.8 (1.5 on 8/26), and potassium 3.4. Labs were ordered by Dr. Sherril Croon. She also had a CBC with hemoglobin 10.3, hematocrit 31, and platelets 165. TSH level was 0.25, and she is on Synthroid.  Clinically, she complains of persistent fatigue and feeling "cold". She denies chest pain, significant dyspnea, tachycardia palpitations, orthopnea, or PND. She does have chronic lower extremity edema. Since her last visit, she denies any recurrent falls. She ambulates with use of walker.  A repeat followup chest x-ray was negative for evidence of a lung nodule, with right lung scarring and no active cardiopulmonary disease.  Preventive Screening-Counseling & Management  Alcohol-Tobacco     Smoking Status: never  Current Medications (verified): 1)  Nitroglycerin 0.4 Mg Subl (Nitroglycerin) .... Place 1 Tablet Under Tongue As Directed 2)  Synthroid 88 Mcg Tabs (Levothyroxine Sodium) .... Take 1 Tablet By Mouth Once A Day 3)  Tylenol Pm Extra Strength 500-25 Mg Tabs (Diphenhydramine-Apap (Sleep)) .... Take 2 Tablet By Mouth Once A Day 4)  Calcium-Magnesium-Zinc 333-133-8.3 Mg Tabs (Calcium-Magnesium-Zinc) .... Take 1 Tablet By Mouth Two Times A Day 5)  Zyrtec Allergy 10 Mg Tabs (Cetirizine Hcl) .... Take 1 Tablet By Mouth Once A Day 6)  Hematinic/folic Acid 324-1 Mg Tabs (Ferrous Fumarate-Folic Acid) .... Take 1 Tablet By Mouth Once A Day 7)  Folic Acid 400 Mcg Tabs (Folic Acid) .... Take 1 Tablet By Mouth Once A Day 8)  Fish Oil 1200 Mg Caps (Omega-3 Fatty Acids) .... Take 1 Tablet By Mouth Two Times A Day 9)  Flax Seed Oil 1000 Mg Caps (Flaxseed (Linseed)) .... Take 1 Tablet By Mouth Two Times A Day 10)  Coq10 100 Mg Caps  (Coenzyme Q10) .... Take 1 Tablet By Mouth Once A Day 11)  Vitamin C 500 Mg Tabs (Ascorbic Acid) .... Take 1 Tablet By Mouth Two Times A Day 12)  Vitamin E 400 Unit Caps (Vitamin E) .... Take 1 Tablet By Mouth Once A Day 13)  B-Caro-T 15 Mg Caps (Beta Carotene) .... Take 1 Tablet By Mouth Once A Day 14)  Vitamin D 400 Unit Caps (Cholecalciferol) .... Take 1 Tablet By Mouth Two Times A Day 15)  Zinc/copper 80mg  .... Take 2 Tablet By Mouth Once A Day 16)  Cardura 2 Mg Tabs (Doxazosin Mesylate) .... Take 1 Tablet By Mouth Once A Day 17)  Diltiazem Hcl Er Beads 180 Mg Xr24h-Cap (Diltiazem Hcl Er Beads) .... Take 1 Capsule By Mouth Two Times A Day For Htn 18)  Metoprolol Tartrate 100 Mg Tabs (Metoprolol Tartrate) .... Take 1 Tablet By Mouth Two Times A Day For Htn 19)  Hydrochlorothiazide 25 Mg Tabs (Hydrochlorothiazide) .... Take 1/2 Tablet By Mouth Once Daily For Htn 20)  Aspirin 81 Mg Tbec (Aspirin) .... Take 1 Tablet By Mouth Once Daily For Prevention of Strokes. 21)  Benadryl 25 Mg Caps (Diphenhydramine Hcl) .... Take 1 Tablet By Mouth Once A Day As Needed 22)  Lasix 20 Mg Tabs (Furosemide) .... Take 1 Tablet By Mouth Once A Day 23)  Klor-Con M10 10 Meq Cr-Tabs (Potassium Chloride Crys Cr) .... Take 1 Tablet By Mouth Once A Day Only When Taking 40mg  Lasix Not With 20mg  24)  Sam-E  Complete 400 Mg Tbec (S-Adenosylmethionine) .... Take 1 Tablet By Mouth Once A Day  Allergies: 1)  ! Darvocet 2)  ! Prednisone 3)  ! * Statins 4)  ! Lisinopril 5)  ! Codeine  Comments:  Nurse/Medical Assistant: The patient's medication list and allergies were reviewed with the patient and were updated in the Medication and Allergy Lists.  Past History:  Past Medical History: Last updated: 06/11/2010 LV  good CAD    PCI for MI...2007 Carotid artery disease. 50% R. ICA stenosis, 50-70% LICA stenosis(November 2009) no significant change  /  Doppler December, 2010... less than 50% bilaterally. Peripheral  arterial disease of the lower extremity. Renal mass   right.....biopsied and worked up in the past. Dyslipidemia. Hypertension  Hypothyroidism  Anemia    normocytic Atrial fibrillation..Dr. Ladona Ridgel 03/2008 felt symptoms could be controlled with as needed beta-blocker and follow    digoxin started Feb 26, 2010 for elevated heart rate  /  rate controlled Mar 09, 2010 /  PTVDP  05/10/2010 Coumadin Rx...Marland KitchenMarland Kitchenon hold by [facility] after fall....03/2010 carpal tunnel vertigo in the past. cervical degenerative disease. syncopal episode that is not yet completely explained.  It is possible that this could have been vasovagal.  EF   60-65%(September 02, 2008) MR   mild/mod Aortic valve thickening but no aortic stenosis Sinus bradycardia on Holter (November 2009) CVA.... while in the hospital February, 2011. Cough    May, 2011 ACE Inhibitor cough...resolved   Mar 09, 2010 Edema Mechanical fall with ruptured globe   [facility]  04/05/2010 Falling...recurrent...04/2010////   brady / tachy   PTVDP placed..05/10/2010 PTVDP    04/2010 Abnormal CXRay  04/2010...Marland Kitchenneeds f/u PA / Lat film Renal insufficiency  Review of Systems       No fevers, chills, hemoptysis, dysphagia, melena, hematocheezia, hematuria, rash, claudication, orthopnea, or pnd. All other systems negative.   Vital Signs:  Patient profile:   77 year old female Height:      62 inches Weight:      134 pounds BMI:     24.60 Pulse rate:   67 / minute BP sitting:   160 / 89  (left arm) Cuff size:   regular  Vitals Entered By: Carlye Grippe (August 13, 2010 10:38 AM)  Physical Exam  Additional Exam:  GEN: 77 year old female, sitting upright, in no distress HEENT: NCAT,PERRLA,EOMI NECK: palpable pulses, no bruits; positive JVD at 90; no bruits LUNGS: CTA bilaterally HEART: RRR (S1S2); no significant murmurs; no rubs; no gallops ABD: soft, NT; intact BS EXT: 2-3+ bilateral, nonpitting edema SKIN: warm, dry MUSC: no obvious deformity NEURO: A/O  (x3)     EKG  Procedure date:  08/13/2010  Findings:      ventricular pacing at 80 bpm (without magnet), with underlying atrial fibrillation  PPM Specifications Following MD:  Sherryl Manges, MD     PPM Vendor:  [employer]tronic     PPM Model Number:  [id number]     PPM Serial Number:  [id number] PPM DOI:  05/10/2010     PPM Implanting MD:  Sherryl Manges, MD  Lead 1    Location: RV     DOI: 05/10/2010     Model #: [id number]     Serial #: [id number]     Status: active  Magnet Response Rate:  BOL 85 ERI 65  Indications:  Tachy-brady syndrome   Parameters Mode:  VVI     Lower Rate Limit:  60     Upper Rate Limit:  130  Impression &  Recommendations:  Problem # 1:  ATRIAL FIBRILLATION (ICD-427.31)  rate controlled on current medication regimen. The issue of anticoagulation with Coumadin was raised, and discussed with the patient and her daughter, by Dr. Myrtis Ser.  At this point, however, the patient is reluctant to proceed, and the daughter is still concerned about her mother's mobility and the risk of recurrent falling. Continue low-dose aspirin.  Problem # 2:  PACEMAKER, PERMANENT (ICD-V45.01)  patient is scheduled to established with Dr. Johney Frame here next month, status post PPM on 7/11, for tachybradycardia syndrome.  Problem # 3:  EDEMA (ICD-782.3)  stable on current diuretic regimen of low-dose Lasix and HCTZ.  Problem # 4:  HYPOTHYROIDISM (ICD-244.9)  Will decrease Synthroid from 100 back to 0.088 mg daily. Repeat TSH level in 6 weeks.  Future Orders: T-TSH [phone]) ... 09/24/2010  Problem # 5:  RENAL INSUFFICIENCY (ICD-588.9) Assessment: Comment Only  Slight increase in creatinine to 1.8, by recent labs. We'll continue to monitor closely.  Problem # 6:  HYPERTENSION (ICD-401.9) Assessment: Comment Only  Her updated medication list for this problem includes:    Cardura 2 Mg Tabs (Doxazosin mesylate) .Marland Kitchen... Take 1 tablet by mouth once a day    Diltiazem Hcl Er Beads 180 Mg  Xr24h-cap (Diltiazem hcl er beads) .Marland Kitchen... Take 1 capsule by mouth two times a day for htn    Metoprolol Tartrate 100 Mg Tabs (Metoprolol tartrate) .Marland Kitchen... Take 1 tablet by mouth two times a day for htn    Hydrochlorothiazide 25 Mg Tabs (Hydrochlorothiazide) .Marland Kitchen... Take 1/2 tablet by mouth once daily for htn    Aspirin 81 Mg Tbec (Aspirin) .Marland Kitchen... Take 1 tablet by mouth once daily for prevention of strokes.    Lasix 20 Mg Tabs (Furosemide) .Marland Kitchen... Take 1 tablet by mouth once a day  Other Orders: EKG w/ Interpretation (93000)  Patient Instructions: 1)  Decrease Synthroid to 0.088mg  ( )  daily 2)  Lab:  TSH in 6 weeks 3)  Follow up in  3 months Prescriptions: SYNTHROID 88 MCG TABS (LEVOTHYROXINE SODIUM) Take 1 tablet by mouth once a day  #30 x 6   Entered by:   Hoover Brunette, LPN   Authorized by:   Talitha Givens, MD, [facility]   Signed by:   Hoover Brunette, LPN on 09/81/1914   Method used:   Electronically to        [facility] (retail)       [address]       [place], [place]  [address]       Ph: [phone]       Fax: [phone]   RxID:   [id number]  I have reviewed and approved all prescriptions at the time of the office visit. Nelida Meuse, PA-C  August 13, 2010 12:43 PM

## 2010-11-16 NOTE — Letter (Signed)
Summary: Engineer, materials at [facility]  [address]   [place], [place] [address]   Phone: [phone]  Fax: [phone]        June 15, 2010 MRN: [id number]    [facility] [address] [place], [place]  [address]    Dear Ms. Brackeen,  Your test ordered by Selena Batten has been reviewed by your physician (or physician assistant) and was found to be normal or stable. Your physician (or physician assistant) felt no changes were needed at this time.  ____ Echocardiogram  ____ Cardiac Stress Test  __X__ Lab Work  ____ Peripheral vascular study of arms, legs or neck  _X___ CT scan or X-ray  ____ Lung or Breathing test  ____ Other:   Thank you.   Cyril Loosen, RN, BSN    Duane Boston, M.D., F.A.C.C. Thressa Sheller, M.D., F.A.C.C. Oneal Grout, M.D., F.A.C.C. Cheree Ditto, M.D., F.A.C.C. Daiva Nakayama, M.D., F.A.C.C. Kenney Houseman, M.D., F.A.C.C. Jeanne Ivan, PA-C

## 2010-11-16 NOTE — Consult Note (Signed)
Summary: Consultation Report/ PROGRESS NOTE  Consultation Report/ PROGRESS NOTE   Imported By: Dorise Hiss 06/09/2010 15:49:22  _____________________________________________________________________  External Attachment:    Type:   Image     Comment:   External Document

## 2010-11-16 NOTE — Miscellaneous (Signed)
Summary: Home Care Report/ [facility]  Home Care Report/ [facility]   Imported By: Dorise Hiss 04/22/2010 14:14:51  _____________________________________________________________________  External Attachment:    Type:   Image     Comment:   External Document

## 2010-11-16 NOTE — Progress Notes (Signed)
Summary: Increased swelling per home health nurse  Phone Note Other Incoming Call back at [phone]   Caller: Angie with [facility] Summary of Call: Pt seen today by Karoline Caldwell, home health nurse. Pt c/o having more difficulty walking. Nurse feels this is d/t increased swelling in her (L) LE. Pt also states she noticed some swelling in her hands b/c she's having difficulty getting her rings on. Pt denies SOB. No increase in wt. She weighed 140 when she came home from nursing center and is now 138 lbs. Lungs are clear per home health nurse. Vital signs this am are as follows: (R) 128/78, (L) 130/84; Pulse: 84bpm, irregular; O2 sat 98% on RA. Pt took Lasix 40mg  this am as instructed. Angie wanted to inform Dr. Myrtis Ser of increased swelling. Initial call taken by: Cyril Loosen, RN, BSN,  March 25, 2010 10:04 AM  Follow-up for Phone Call        Have her take lasix 40 two times a day for two days. Follow weight. Talk to her again on Monday. ALSO, I reviewed recent labs again. I missed that TSH was 9. Increase Synthroid to 100 micrograms daily. Talitha Givens, MD, [facility]  March 26, 2010 7:41 AM  Additional Follow-up for Phone Call Additional follow up Details #1::        Synetta Fail with home health notified and verbalized understanding.  Additional Follow-up by: Cyril Loosen, RN, BSN,  March 26, 2010 10:09 AM    New/Updated Medications: SYNTHROID 100 MCG TABS (LEVOTHYROXINE SODIUM) Take 1 tablet by mouth once a day Prescriptions: SYNTHROID 100 MCG TABS (LEVOTHYROXINE SODIUM) Take 1 tablet by mouth once a day  #30 x 6   Entered by:   Cyril Loosen, RN, BSN   Authorized by:   Talitha Givens, MD, [facility]   Signed by:   Cyril Loosen, RN, BSN on 03/26/2010   Method used:   Electronically to        [facility] (retail)       [address]       [place], [place]  [address]       Ph: [phone]       Fax: [phone]   RxID:   [id number]

## 2010-11-16 NOTE — Cardiovascular Report (Signed)
Summary: Office Visit   Office Visit   Imported By: Roderic Ovens 06/07/2010 13:15:01  _____________________________________________________________________  External Attachment:    Type:   Image     Comment:   External Document

## 2010-11-16 NOTE — Procedures (Signed)
Summary: wch   Current Medications (verified): 1)  Nitroglycerin 0.4 Mg Subl (Nitroglycerin) .... Place 1 Tablet Under Tongue As Directed 2)  Synthroid 100 Mcg Tabs (Levothyroxine Sodium) .... Take 1 Tablet By Mouth Once A Day 3)  Tylenol Pm Extra Strength 500-25 Mg Tabs (Diphenhydramine-Apap (Sleep)) .... Take 2 Tablet By Mouth Once A Day 4)  Calcium-Magnesium-Zinc 333-133-8.3 Mg Tabs (Calcium-Magnesium-Zinc) .... Take 1 Tablet By Mouth Two Times A Day 5)  Zyrtec Allergy 10 Mg Tabs (Cetirizine Hcl) .... Take 1 Tablet By Mouth Once A Day 6)  Hematinic/folic Acid 324-1 Mg Tabs (Ferrous Fumarate-Folic Acid) .... Take 1 Tablet By Mouth Once A Day 7)  Folic Acid 400 Mcg Tabs (Folic Acid) .... Take 1 Tablet By Mouth Once A Day 8)  Fish Oil 1200 Mg Caps (Omega-3 Fatty Acids) .... Take 1 Tablet By Mouth Two Times A Day 9)  Flax Seed Oil 1000 Mg Caps (Flaxseed (Linseed)) .... Take 1 Tablet By Mouth Two Times A Day 10)  Coq10 100 Mg Caps (Coenzyme Q10) .... Take 1 Tablet By Mouth Once A Day 11)  Vitamin C 500 Mg Tabs (Ascorbic Acid) .... Take 1 Tablet By Mouth Two Times A Day 12)  Vitamin E 400 Unit Caps (Vitamin E) .... Take 1 Tablet By Mouth Once A Day 13)  B-Caro-T 15 Mg Caps (Beta Carotene) .... Take 1 Tablet By Mouth Once A Day 14)  Vitamin D 400 Unit Caps (Cholecalciferol) .... Take 1 Tablet By Mouth Two Times A Day 15)  Zinc/copper 80mg  .... Take 2 Tablet By Mouth Once A Day 16)  Isopto Hyoscine 0.25 % Soln (Scopolamine Hbr) .... One Drop To Od Two Times A Day 17)  Pred Forte 1 % Susp (Prednisolone Acetate) .... One Drop To Od 2-4 Times A Day 18)  Cardura 2 Mg Tabs (Doxazosin Mesylate) .... Take 1 Tablet By Mouth Once A Day 19)  Diltiazem Hcl Er Beads 180 Mg Xr24h-Cap (Diltiazem Hcl Er Beads) .... Take 1 Capsule By Mouth Two Times A Day For Htn 20)  Metoprolol Tartrate 100 Mg Tabs (Metoprolol Tartrate) .... Take 1 Tablet By Mouth Two Times A Day For Htn 21)  Hydrochlorothiazide 25 Mg Tabs  (Hydrochlorothiazide) .... Take 1/2 Tablet By Mouth Once Daily For Htn 22)  Aspirin 81 Mg Tbec (Aspirin) .... Take 1 Tablet By Mouth Once Daily For Prevention of Strokes.  Allergies (verified): 1)  ! Darvocet 2)  ! Prednisone 3)  ! * Statins 4)  ! Lisinopril  PPM Specifications Following MD:  Sherryl Manges, MD     PPM Vendor:  [employer]     PPM Model Number:  [id number]     PPM Serial Number:  [id number] PPM DOI:  05/10/2010     PPM Implanting MD:  Sherryl Manges, MD  Lead 1    Location: RV     DOI: 05/10/2010     Model #: [id number]     Serial #: [id number]     Status: active  Magnet Response Rate:  BOL 85 ERI 65  Indications:  Tachy-brady syndrome   PPM Follow Up Battery Voltage:  2.80 V     Battery Est. Longevity:  10 yrs     Right Ventricle  Amplitude: 31.36 mV, Impedance: 725 ohms, Threshold: 0.750 V at 0.40 msec  Episodes MS Episodes:  0     Ventricular High Rate:  0     Ventricular Pacing:  50.6%  Parameters Mode:  VVI     Lower Rate  Limit:  60     Upper Rate Limit:  130 Next Cardiology Appt Due:  08/17/2010 Tech Comments:  WOUND CHECK--STERI STRIPS REMOVED.  SITE W/O REDNESS OR SWELLING.  NORMAL DEVICE FUNCTION.  NO CHANGES MADE.  ROV IN 3 MTHS W/JA. Vella Kohler  May 31, 2010 3:58 PM

## 2010-11-16 NOTE — Assessment & Plan Note (Signed)
Summary: 1 WK F/U PER DR. Nathanel Tallman-JM   Visit Type:  Follow-up Primary Provider:  Dhruv Vyas,MD  CC:  atrial fibrillation.  History of Present Illness: The patient is seen for followup of atrial fibrillation and fluid overload.  I saw her last on Feb 16, 2010.  At that time I increased her Lasix.  We followed her labs were the decision was made to use digoxin.  Her creatinine when initially checked was 2.0.  Digoxin was not started.  Followup creatinine was 1.8 after some diuresis.  She had a cough.  This has improved off lisinopril and with some diuresis.  Her edema is improved.  Her heart rate is still fast.  Preventive Screening-Counseling & Management  Alcohol-Tobacco     Smoking Status: never  Current Medications (verified): 1)  Nitroglycerin 0.4 Mg Subl (Nitroglycerin) .... Place 1 Tablet Under Tongue As Directed 2)  Synthroid 88 Mcg Tabs (Levothyroxine Sodium) .Marland Kitchen.. 1 By Mouth Once Daily 3)  Metoprolol Tartrate 25 Mg Tabs (Metoprolol Tartrate) .... Take 1 Tablet By Mouth Once A Day (Hold For Hr Less Than 60) 4)  Cardizem Cd 360 Mg Xr24h-Cap (Diltiazem Hcl Coated Beads) .... Take 1 Tablet By Mouth Once A Day 5)  Furosemide 40 Mg Tabs (Furosemide) .... Take One Tablet By Mouth Daily As Directed 6)  Multivitamins  Tabs (Multiple Vitamin) .... Take 1 Tablet By Mouth Once A Day 7)  Vitamin D3 1000 Unit Caps (Cholecalciferol) .... Take 1 Tablet By Mouth Once A Day 8)  Allegra 180 Mg Tabs (Fexofenadine Hcl) .... Take 1 Tablet By Mouth Once A Day 9)  Klor-Con M10 10 Meq Cr-Tabs (Potassium Chloride Crys Cr) .... Take One By Mouth Every Other Day 10)  Coumadin 3 Mg Tabs (Warfarin Sodium) .... Take 1 Tablet By Mouth Once A Day 11)  Cardura 2 Mg Tabs (Doxazosin Mesylate) .... Take 1 Tablet By Mouth Once A Day At Bedtime 12)  Tylenol 325 Mg Tabs (Acetaminophen) .... Take One-Two By Mouth Every 4hours As Needed Pain 13)  Dulcolax 10 Mg Supp (Bisacodyl) .... Take 1 Tablet By Mouth Once A Day At  Bedtime As Needed Constipation 14)  Oscal 500/200 D-3 500-200 Mg-Unit Tabs (Calcium-Vitamin D) .... Take 1 Tablet By Mouth Two Times A Day  Allergies: 1)  ! Darvocet 2)  ! Prednisone 3)  ! * Statins  Comments:  Nurse/Medical Assistant: The patient's medications were reviewed with the patient and were updated in the Medication List. Pt verbally confirmed medications. Pt's dgt states no changes in meds from last ov. Cyril Loosen, RN, BSN (Feb 26, 2010 2:07 PM)  Past History:  Past Medical History: LV  good 1. Coronary artery disease with an intervention in 2007, in the     setting of a myocardial infarction.   2. Carotid artery disease. 50% R. ICA stenosis, 50-70% LICA stenosis(November 2009) no significant change  /  Doppler December, 2010... less than 50% bilaterally.. further data will be needed to compare to prior studies 3. Peripheral arterial disease of the lower extremity. right renal mass that was biopsied and worked up in the past. 5. Dyslipidemia. 6. Hypertension treated. 7. Hypothyroidism treated. normocytic anemia. atrial fibrillation..Dr. Ladona Ridgel 03/2008 felt symptoms could be controlled with as needed beta-blocker and follow cataract surgery.  She hopes to have another cataract operation soon, but I will put this on hold. carpal tunnel and C-section. vertigo in the past. cervical degenerative disease. syncopal episode that is not yet completely explained.  It is possible  that this could have been vasovagal.  Ejection fraction 60-65%(September 02, 2008) Mild-to-moderate left atrial dilatation Mild-to-moderate mitral regurgitation Aortic valve thickening but no aortic stenosis Mild tricuspid regurgitation Sinus bradycardia on Holter (November 2009) CVA.... while in the hospital February, 2011. Cough    May, 2011 ACE Inhibitor cough    Review of Systems       Patient denies fever, chills, headache, sweats, rash, change in vision, change in hearing, chest pain,  nausea vomiting, urinary symptoms.  Her cough is definitely improving.  All of the systems are reviewed and are negative  Vital Signs:  Patient profile:   77 year old female Height:      62 inches Weight:      140.75 pounds Pulse rate:   50 / minute BP sitting:   129 / 84  (right arm) Cuff size:   regular  Vitals Entered By: Cyril Loosen, RN, BSN (Feb 26, 2010 2:06 PM) CC: atrial fibrillation Comments Cough improved   Physical Exam  General:  patient is better today. Head:  head is atraumatic. Eyes:  no xanthelasma. Neck:  no jugulovenous stenting. Chest Wall:  no chest wall tenderness. Lungs:  lungs reveal a few scattered rhonchi. Heart:  cardiac exam reveals an irregularly irregular rhythm. Abdomen:  abdomen is soft. Msk:  no musculoskeletal deformities. Extremities:  trace peripheral edema. Skin:  no skin rashes. Psych:  patient is oriented to person time and place.  Affect is normal.   Impression & Recommendations:  Problem # 1:  COUGH (ICD-786.2)  Her updated medication list for this problem includes:    Nitroglycerin 0.4 Mg Subl (Nitroglycerin) .Marland Kitchen... Place 1 tablet under tongue as directed    Metoprolol Tartrate 25 Mg Tabs (Metoprolol tartrate) .Marland Kitchen... Take 1 tablet by mouth once a day (hold for hr less than 60)    Cardizem Cd 360 Mg Xr24h-cap (Diltiazem hcl coated beads) .Marland Kitchen... Take 1 tablet by mouth once a day    Coumadin 3 Mg Tabs (Warfarin sodium) .Marland Kitchen... Take 1 tablet by mouth once a day Cough is improving.  It seems to be from a combination of ACE inhibitor and some fluid overload.  We will continue to give her time off of the ACE inhibitor.  Her over all fluid status appears to be better today.  Problem # 2:  EDEMA (ICD-782.3) Edema is improved.  We will not push for diuresis further.  Problem # 3:  HYPERTENSION (ICD-401.9)  Her updated medication list for this problem includes:    Metoprolol Tartrate 25 Mg Tabs (Metoprolol tartrate) .Marland Kitchen... Take 1 tablet by  mouth once a day (hold for hr less than 60)    Cardizem Cd 360 Mg Xr24h-cap (Diltiazem hcl coated beads) .Marland Kitchen... Take 1 tablet by mouth once a day    Furosemide 40 Mg Tabs (Furosemide) .Marland Kitchen... Take one tablet by mouth daily as directed    Cardura 2 Mg Tabs (Doxazosin mesylate) .Marland Kitchen... Take 1 tablet by mouth once a day at bedtime Blood pressure is stable.  Change in therapy.  Problem # 4:  HYPOTHYROIDISM (ICD-244.9)  Her updated medication list for this problem includes:    Synthroid 88 Mcg Tabs (Levothyroxine sodium) .Marland Kitchen... 1 by mouth once daily We will arrange for the patient have a followup TSH drawn by her home health nurse.  Problem # 5:  ATRIAL FIBRILLATION (ICD-427.31)  Her updated medication list for this problem includes:    Metoprolol Tartrate 25 Mg Tabs (Metoprolol tartrate) .Marland Kitchen... Take 1 tablet by mouth  once a day (hold for hr less than 60)    Coumadin 3 Mg Tabs (Warfarin sodium) .Marland Kitchen... Take 1 tablet by mouth once a day    Lanoxin 0.125 Mg Tabs (Digoxin) .Marland Kitchen... Take 2 tablets two times a day for 2 days then decrease to 1/2 tablet daily.  Orders: EKG w/ Interpretation (93000) EKG is done and reviewed by me.  She had rapid atrial fibrillation.  This is despite being on a beta blocker and a calcium blocker.  Digoxin will be added.  I'll see her back for early followup.  Problem # 6:  RENAL INSUFFICIENCY (ICD-588.9) On May 5 her creatinine was 2.0.  With some diuresis her creatinine is 1.8 on Feb 23, 2010.  She will have chemistry drawn along with a TSH before I see her back.  Patient Instructions: 1)  FOLLOW UP APPT WITH DR. Qasim Diveley ON TUESDAY, MAY 24TH AT 2:30PM. 2)  START DIGOXIN (LANOXIN) 0.125MG  TABLETS. TAKE 2 TABLETS TWICE PER DAY FOR 2 DAYS AND THEN DECREASE TO 1/2 TABLET DAILY.  Prescriptions: LANOXIN 0.125 MG TABS (DIGOXIN) Take 2 tablets two times a day for 2 days then decrease to 1/2 tablet daily.  #20 x 6   Entered by:   Cyril Loosen, RN, BSN   Authorized by:   Talitha Givens, MD, [facility]   Signed by:   Cyril Loosen, RN, BSN on 02/26/2010   Method used:   Electronically to        [facility] (retail)       [address]       [place], [place]  [address]       Ph: [phone]       Fax: [phone]   RxID:   [id number]   Handout requested.

## 2010-11-16 NOTE — Miscellaneous (Signed)
Summary: Home Care Report/ [facility]  Home Care Report/ [facility]   Imported By: Dorise Hiss 05/26/2010 08:44:04  _____________________________________________________________________  External Attachment:    Type:   Image     Comment:   External Document

## 2010-11-16 NOTE — Assessment & Plan Note (Signed)
Summary: 1 MONTH F/U PER 5/24 OV-JM   Visit Type:  Follow-up Primary Provider:  Dhruv Vyas,MD  CC:  atrial fibrillation.  History of Present Illness: The patient is seen for followup of atrial fibrillation.  Since seen her last patient had a mechanical fall and fractured the orbit of her right eye.  She was treated at [facility].  Fortunately she did well.  In that setting her Coumadin was held.  Some of her other medicines were changed slightly because they did not have the exact record of her outpatient meds.  This will be corrected today.  She did not have syncope.  Also the patient's TSH had been elevated and we had changed her dose up to 100 micrograms daily.  It is now time to repeat her lab.  Preventive Screening-Counseling & Management  Alcohol-Tobacco     Smoking Status: never  Current Medications (verified): 1)  Nitroglycerin 0.4 Mg Subl (Nitroglycerin) .... Place 1 Tablet Under Tongue As Directed 2)  Synthroid 100 Mcg Tabs (Levothyroxine Sodium) .... Take 1 Tablet By Mouth Once A Day 3)  Cardizem Cd 300 Mg Xr24h-Cap (Diltiazem Hcl Coated Beads) .... Take 1 Tablet By Mouth Once A Day 4)  Furosemide 40 Mg Tabs (Furosemide) .... Take One Tablet By Mouth Daily 5)  Klor-Con M10 10 Meq Cr-Tabs (Potassium Chloride Crys Cr) .... Take One By Mouth Every  Day With Lasix 6)  Tylenol Pm Extra Strength 500-25 Mg Tabs (Diphenhydramine-Apap (Sleep)) .... Take 2 Tablet By Mouth Once A Day 7)  Calcium-Magnesium-Zinc 333-133-8.3 Mg Tabs (Calcium-Magnesium-Zinc) .... Take 1 Tablet By Mouth Two Times A Day 8)  Zyrtec Allergy 10 Mg Tabs (Cetirizine Hcl) .... Take 1 Tablet By Mouth Once A Day 9)  Hematinic/folic Acid 324-1 Mg Tabs (Ferrous Fumarate-Folic Acid) .... Take 1 Tablet By Mouth Once A Day 10)  Folic Acid 400 Mcg Tabs (Folic Acid) .... Take 1 Tablet By Mouth Once A Day 11)  Fish Oil 1200 Mg Caps (Omega-3 Fatty Acids) .... Take 1 Tablet By Mouth Two Times A Day 12)  Sam-E Complete 400 Mg Tbec  (S-Adenosylmethionine) .... Take 1 Tablet By Mouth Once A Day 13)  Flax Seed Oil 1000 Mg Caps (Flaxseed (Linseed)) .... Take 1 Tablet By Mouth Two Times A Day 14)  Coq10 100 Mg Caps (Coenzyme Q10) .... Take 1 Tablet By Mouth Once A Day 15)  Vitamin C 500 Mg Tabs (Ascorbic Acid) .... Take 1 Tablet By Mouth Two Times A Day 16)  Vitamin E 400 Unit Caps (Vitamin E) .... Take 1 Tablet By Mouth Once A Day 17)  B-Caro-T 15 Mg Caps (Beta Carotene) .... Take 1 Tablet By Mouth Once A Day 18)  Carvedilol 12.5 Mg Tabs (Carvedilol) .... Take 1 Tablet By Mouth Two Times A Day 19)  Vitamin D 400 Unit Caps (Cholecalciferol) .... Take 1 Tablet By Mouth Two Times A Day 20)  Zinc/copper 80mg  .... Take 2 Tablet By Mouth Once A Day 21)  Isopto Hyoscine 0.25 % Soln (Scopolamine Hbr) .... One Drop To Od Two Times A Day 22)  Pred Forte 1 % Susp (Prednisolone Acetate) .... One Drop To Od 2-4 Times A Day  Allergies (verified): 1)  ! Darvocet 2)  ! Prednisone 3)  ! * Statins 4)  ! Lisinopril  Comments:  Nurse/Medical Assistant: The patient's medication list and allergies were reviewed with the patient and were updated in the Medication and Allergy Lists.  Past History:  Past Medical History: LV  good  CAD    PCI for MI...2007 Carotid artery disease. 50% R. ICA stenosis, 50-70% LICA stenosis(November 2009) no significant change  /  Doppler December, 2010... less than 50% bilaterally. Peripheral arterial disease of the lower extremity. Renal mass   right.....biopsied and worked up in the past. Dyslipidemia. Hypertension  Hypothyroidism  Anemia    normocytic Atrial fibrillation..Dr. Ladona Ridgel 03/2008 felt symptoms could be controlled with as needed beta-blocker and follow    digoxin started Feb 26, 2010 for elevated heart rate  /  rate controlled Mar 09, 2010 Coumadin Rx...Marland KitchenMarland Kitchenon hold by [facility] after fall....03/2010 carpal tunnel vertigo in the past. cervical degenerative disease. syncopal episode that is not yet  completely explained.  It is possible that this could have been vasovagal.  EF   60-65%(September 02, 2008) MR   mild/mod Aortic valve thickening but no aortic stenosis Sinus bradycardia on Holter (November 2009) CVA.... while in the hospital February, 2011. Cough    May, 2011 ACE Inhibitor cough...resolved   Mar 09, 2010 Edema Mechanical fall with ruptured globe   [facility]  04/05/2010 Renal insufficiency  Review of Systems       Patient denies fever, chills, headache, sweats, rash, change in vision, change in hearing, chest pain, cough, nausea vomiting, urinary symptoms.  All of the systems are reviewed and are negative  Vital Signs:  Patient profile:   77 year old female Height:      62 inches Weight:      138 pounds Pulse rate:   69 / minute BP sitting:   175 / 90  (right arm) Cuff size:   regular  Vitals Entered By: Carlye Grippe (April 21, 2010 1:23 PM)  Physical Exam  General:  patient actually looks quite good today.  There is only very slight ecchymoses remaining in her right face. Head:  head today is atraumatic. Eyes:  no xanthelasma. Neck:  no jugular venous distention. Chest Wall:  no chest wall tenderness. Lungs:  lungs are clear.  Respiratory effort is nonlabored. Heart:  cardiac exam reveals S1-S2.  No clicks or significant murmurs. Abdomen:  abdomen is soft. Msk:  patient has kyphoscoliosis. Extremities:  no peripheral edema. Skin:  no skin rashes. Psych:  patient is oriented to person time and place.  Affect is normal   Impression & Recommendations:  Problem # 1:  COUMADIN THERAPY (ICD-V58.61) Because of her recent fall I will keep her Coumadin on hold.  We will reconsider overtime if her Coumadin can be restarted.I hope that we will be able to restart it in the future and  Problem # 2:  EDEMA (ICD-782.3) There is no edema today.  No further workup.  Problem # 3:  CAD (ICD-414.00)  The following medications were removed from the medication list:     Metoprolol Tartrate 25 Mg Tabs (Metoprolol tartrate) .Marland Kitchen... Take 1 tablet by mouth once a day (hold for hr less than 60)    Coumadin 3 Mg Tabs (Warfarin sodium) .Marland Kitchen... Take 1 tablet by mouth once a day    Carvedilol 12.5 Mg Tabs (Carvedilol) .Marland Kitchen... Take 1 tablet by mouth two times a day Her updated medication list for this problem includes:    Nitroglycerin 0.4 Mg Subl (Nitroglycerin) .Marland Kitchen... Place 1 tablet under tongue as directed    Diltiazem Hcl Er Beads 360 Mg Xr24h-cap (Diltiazem hcl er beads) .Marland Kitchen... Take one capsule by mouth daily    Metoprolol Tartrate 25 Mg Tabs (Metoprolol tartrate) .Marland Kitchen... Take one tablet by mouth twice a day  Coronary disease is stable.  No change in therapy.  Problem # 4:  HYPERTENSION (ICD-401.9)  Blood pressure is elevated today.  Her Cardura had been put on hold.  Will be restarted.  The following medications were removed from the medication list:    Metoprolol Tartrate 25 Mg Tabs (Metoprolol tartrate) .Marland Kitchen... Take 1 tablet by mouth once a day (hold for hr less than 60)    Cardura 2 Mg Tabs (Doxazosin mesylate) .Marland Kitchen... Take 1 tablet by mouth once a day at bedtime    Carvedilol 12.5 Mg Tabs (Carvedilol) .Marland Kitchen... Take 1 tablet by mouth two times a day Her updated medication list for this problem includes:    Diltiazem Hcl Er Beads 360 Mg Xr24h-cap (Diltiazem hcl er beads) .Marland Kitchen... Take one capsule by mouth daily    Furosemide 40 Mg Tabs (Furosemide) .Marland Kitchen... Take one tablet by mouth daily    Metoprolol Tartrate 25 Mg Tabs (Metoprolol tartrate) .Marland Kitchen... Take one tablet by mouth twice a day    Cardura 2 Mg Tabs (Doxazosin mesylate) .Marland Kitchen... Take 1 tablet by mouth once a day  Problem # 5:  HYPOTHYROIDISM (ICD-244.9)  Her updated medication list for this problem includes:    Synthroid 100 Mcg Tabs (Levothyroxine sodium) .Marland Kitchen... Take 1 tablet by mouth once a day And is now time to recheck her TSH as are his thyroid dose has been changed.  Problem # 6:  ATRIAL FIBRILLATION (ICD-427.31) EKG is  done today and reviewed by me.  Atrophic rate is controlled today.  I will not resume her digoxin.  Medicine adjustments are as follows.  Coumadin is on hold and will remain on hold.  Carvedilol which he received at the hospital is about to run out and she'll be put back on Lopressor 25 b.i.d.  Digoxin was stopped will be held.  When she runs out of the diltiazem that she received from the hospital she'll go back to her other diltiazem preparation.  Cardura will be restarted.  I will see her for followup.  On evaluation today I reviewed the extensive notes that came from [facility]..  Other Orders: EKG w/ Interpretation (93000)  Patient Instructions: 1)  Follow up with Dr. Myrtis Ser on Friday, June 11, 2010 at 1:45pm. 2)  Continue to hold Coumadin 3)  Finish Carvedilol and restart Lopressor (metoprolol tartrate) 25mg  by mouth two times a day. 4)  Continue to hold Digoxin. 5)  Finish current dose of Diltiazem 300mg  and then resume Diltiazem 360mg  by mouth once daily. 6)  Resume Cardura at previous dose. Prescriptions: CARDURA 2 MG TABS (DOXAZOSIN MESYLATE) Take 1 tablet by mouth once a day  #30 x 6   Entered by:   Cyril Loosen, RN, BSN   Authorized by:   Talitha Givens, MD, [facility]   Signed by:   Cyril Loosen, RN, BSN on 04/21/2010   Method used:   Electronically to        [facility] (retail)       [address]       [place], [place]  [address]       Ph: [phone]       Fax: [phone]   RxID:   [id number] METOPROLOL TARTRATE 25 MG TABS (METOPROLOL TARTRATE) Take one tablet by mouth twice a day  #60 x 6   Entered by:   Cyril Loosen, RN, BSN   Authorized by:   Talitha Givens, MD, [facility]   Signed by:   Cyril Loosen, RN, BSN on 04/21/2010  Method used:   Electronically to        [facility] (retail)       [address]       [place], [place]  [address]       Ph: [phone]       Fax: [phone]   RxID:   [id number]

## 2010-11-16 NOTE — Medication Information (Signed)
Summary: Environmental health practitioner MEDICATIONS & SURGERY  RX Folder/ MEDICATIONS & SURGERY   Imported By: Dorise Hiss 04/09/2010 10:06:52  _____________________________________________________________________  External Attachment:    Type:   Image     Comment:   External Document

## 2010-11-16 NOTE — Progress Notes (Signed)
Summary: WANT TO HAVE INR CHECKS VIA HOME HEALTH NURSE  Phone Note Call from Patient Call back at [phone]   Caller: Daughter(Cindy Priddy) Call For: doctor Summary of Call: patient is scheduled to have INR check here at our office and is requesting an order be sent to her home health company to have this taken care of since it would be more convient for her. Patient currently uses [facility] in [place]. Their number is [phone].   Initial call taken by: Carlye Grippe,  Feb 22, 2010 11:39 AM  Follow-up for Phone Call        This is fine with me if Misty Stanley can oversee. Talitha Givens, MD, [facility]  Feb 22, 2010 1:59 PM  Additional Follow-up for Phone Call Additional follow up Details #1::        OK for Home Health to check and call results to me.  Have then check INR within next day or two. Additional Follow-up by: Vashti Hey RN,  Feb 22, 2010 2:51 PM    Additional Follow-up for Phone Call Additional follow up Details #2::    daughter informed and orders sent to [facility] company for inr checks and labwork to fax number [phone]. Follow-up by: Carlye Grippe,  Feb 22, 2010 3:07 PM

## 2010-11-16 NOTE — Letter (Signed)
Summary: External Correspondence/ DISCHARGE SUMMARY & NOTE [facility]  External Correspondence/ DISCHARGE SUMMARY & NOTE [facility]   Imported By: Dorise Hiss 04/08/2010 16:29:51  _____________________________________________________________________  External Attachment:    Type:   Image     Comment:   External Document

## 2010-11-16 NOTE — Medication Information (Signed)
Summary: Coumadin Clinic  Anticoagulant Therapy  Managed by: Inactive PCP: Ottie Glazier Supervising MD: Diona Browner MD, Remi Deter Indication 1: Atrial Fibrillation Indication 2: Cerebrovascular Accident Lab Used: [facility] Site: [place]          Comments: Coumadin stopped at [facility] after fall.  They will leave pt off coumadin until she see's Dr Myrtis Ser back in July.  Allergies: 1)  ! Darvocet 2)  ! Prednisone 3)  ! * Statins 4)  ! Lisinopril  Anticoagulation Management History:      Positive risk factors for bleeding include an age of 75 years or older.  The bleeding index is 'intermediate risk'.  Positive CHADS2 values include History of HTN and Age > 40 years old.  Her last INR was 2.1.  Anticoagulation responsible provider: Diona Browner MD, Remi Deter.    Anticoagulation Management Assessment/Plan:      The patient's current anticoagulation dose is Coumadin 3 mg tabs: Take 1 tablet by mouth once a day.  The target INR is 2.0-3.0.  The next INR is due 04/01/2010.  Anticoagulation instructions were given to [facility].  Results were reviewed/authorized by Inactive.         Prior Anticoagulation Instructions: Received fax with results of PT/INR obtained on pt 03/18/10.  PT 21.7  INR 2.1  Order given for pt to continue coumadin 3mg  once daily except 1.5mg  on Mondays.  Home Health to recheck INR on 04/01/10 and fax to office.

## 2010-11-16 NOTE — Miscellaneous (Signed)
  Clinical Lists Changes  Problems: Added new problem of * ACE  COUGH Added new problem of CVA (ICD-434.91) Added new problem of COUMADIN THERAPY (ICD-V58.61) Added new problem of * PAD Observations: Added new observation of PAST MED HX: LV  good CAD    PCI for MI...2007 Carotid artery disease. 50% R. ICA stenosis, 50-70% LICA stenosis(November 2009) no significant change  /  Doppler December, 2010... less than 50% bilaterally. Peripheral arterial disease of the lower extremity. Renal mass   right.....biopsied and worked up in the past. Dyslipidemia. Hypertension  Hypothyroidism  Anemia    normocytic Atrial fibrillation..Dr. Ladona Ridgel 03/2008 felt symptoms could be controlled with as needed beta-blocker and follow    digoxin started Feb 26, 2010 for elevated heart rate  /  rate controlled Mar 09, 2010 Coumadin Rx...Marland KitchenMarland Kitchenon hold by [facility] after fall....03/2010 carpal tunnel vertigo in the past. cervical degenerative disease. syncopal episode that is not yet completely explained.  It is possible that this could have been vasovagal.  EF   60-65%(September 02, 2008) MR   mild/mod Aortic valve thickening but no aortic stenosis Sinus bradycardia on Holter (November 2009) CVA.... while in the hospital February, 2011. Cough    May, 2011 ACE Inhibitor cough...resolved   Mar 09, 2010 Edema Mechanical fall with ruptured globe   [facility]  04/05/2010 Renal insufficiency (04/20/2010 18:46) Added new observation of PRIMARY MD: Dhruv Vyas,MD (04/20/2010 18:46)       Past History:  Past Medical History: LV  good CAD    PCI for MI...2007 Carotid artery disease. 50% R. ICA stenosis, 50-70% LICA stenosis(November 2009) no significant change  /  Doppler December, 2010... less than 50% bilaterally. Peripheral arterial disease of the lower extremity. Renal mass   right.....biopsied and worked up in the past. Dyslipidemia. Hypertension  Hypothyroidism  Anemia    normocytic Atrial fibrillation..Dr.  Ladona Ridgel 03/2008 felt symptoms could be controlled with as needed beta-blocker and follow    digoxin started Feb 26, 2010 for elevated heart rate  /  rate controlled Mar 09, 2010 Coumadin Rx...Marland KitchenMarland Kitchenon hold by [facility] after fall....03/2010 carpal tunnel vertigo in the past. cervical degenerative disease. syncopal episode that is not yet completely explained.  It is possible that this could have been vasovagal.  EF   60-65%(September 02, 2008) MR   mild/mod Aortic valve thickening but no aortic stenosis Sinus bradycardia on Holter (November 2009) CVA.... while in the hospital February, 2011. Cough    May, 2011 ACE Inhibitor cough...resolved   Mar 09, 2010 Edema Mechanical fall with ruptured globe   [facility]  04/05/2010 Renal insufficiency

## 2010-11-16 NOTE — Progress Notes (Signed)
Summary: coumadin management  Phone Note Outgoing Call   Call placed by: Vashti Hey RN Call placed to: [facility] Reason for Call: Discuss lab or test results Summary of Call: Received fax from [facility] with results of PT/INR obtained on pt 02/23/10.  PT 26.5  INR 2.6  Called pt to confirm pill strength and dosage.  Pt has 3mg  tablet and takes 3mg  once daily.  Told pt to continue coumadin 3mg  once daily and HH will recheck INR on 03/08/10.  This was also called to Angie at [facility] and for them to recheck INR on 03/08/10.  All verbalized understanding. Initial call taken by: Vashti Hey RN,  Feb 24, 2010 10:15 AM     Anticoagulant Therapy  Managed by: Vashti Hey RN PCP: Ottie Glazier Supervising MD: Diona Browner MD, Remi Deter Indication 1: Atrial Fibrillation Indication 2: Cerebrovascular Accident Lab Used: [facility] [place] Site: [place] PT 26.5  Dietary changes: no    Health status changes: no    Bleeding/hemorrhagic complications: no    Recent/future hospitalizations: yes       Details: pt recently came home from the nursing home.  Pt has been on couamdin since her CVA  Any changes in medication regimen? no    Recent/future dental: no  Any missed doses?: no       Is patient compliant with meds? yes         Anticoagulation Management History:      The patient is taking warfarin and comes in today for a routine follow up visit.  Positive risk factors for bleeding include an age of 41 years or older.  The bleeding index is 'intermediate risk'.  Positive CHADS2 values include History of HTN and Age > 79 years old.  Today's INR is 2.6.  Prothrombin time is 26.5.  Anticoagulation responsible provider: Diona Browner MD, Remi Deter.  Cuvette Lot#: [id number].    Anticoagulation Management Assessment/Plan:      The patient's current anticoagulation dose is Coumadin 3 mg tabs: Take 1 tablet by mouth once a day.  The target  INR is 2.0-3.0.  The next INR is due 03/08/2010.  Anticoagulation instructions were given to patient/Angie [facility].  Results were reviewed/authorized by Vashti Hey RN.  She was notified by Vashti Hey RN.         Current Anticoagulation Instructions: Received fax from [facility] with results of PT/INR obtained on pt 02/23/10.  PT 26.5  INR 2.6  Called pt to confirm pill strength and dosage.  Pt has 3mg  tablet and takes 3mg  once daily.  Told pt to continue coumadin 3mg  once daily and HH will recheck INR on 03/08/10.  This was also called to Angie at [facility] and for them to recheck INR on 03/08/10.  All verbalized understanding.

## 2010-11-16 NOTE — Progress Notes (Signed)
Summary: Wound Check   Phone Note Call from Patient Call back at Home Phone [phone] Call back at [phone]   Caller: patient's daughter Arline Asp Reason for Call: Talk to Nurse Details for Reason: reschedule wound check Summary of Call: Arline Asp called wanting to reschedule the wound check that is scheduled at [facility].  York Spaniel that her mom is in [facility] and the trip is too much for her.   Initial call taken by: Claudette Laws,  May 18, 2010 1:23 PM  Follow-up for Phone Call        Left message to call back on machine. Cyril Loosen, RN, BSN  May 18, 2010 2:16 PM  Discussed w/Kristin. She will check pt in the [place] office on August 12th. Pt set up for August 12th at 1:45pm. Nursing home notified. Follow-up by: Cyril Loosen, RN, BSN,  May 18, 2010 3:41 PM

## 2010-11-16 NOTE — Progress Notes (Signed)
Summary: When can pt stop wearing sling  Phone Note Call from Patient Call back at [phone]   Summary of Call: Pt's dgt, Arline Asp, left message on voicemail regarding pt. She states pt had pacemaker placed last week at [facility]. She states pt is now at [facility] for rehab. She states pt has no instructions as to when she can stop wearing the sling that was placed at [facility]. She would like someone to notify [facility] as to when pt can stop wearing sling.  Initial call taken by: Cyril Loosen, RN, BSN,  May 18, 2010 12:09 PM  Follow-up for Phone Call        Elease Hashimoto, LPN at [facility] notified pt can d/c sling per Belenda Cruise.   Spoke with pt's dgt who has questions regarding pt's medications. Pt's dgt is concerned that pt was told at D/C to resume meds as before and given a list including Lisinopril (which was d/c'd b/c of some allergy) and Cardura, which was d/c'd before admission. She is not sure if pt was actually suppose to start Cardura back or if an old med list was used for d/c. She also states pt is very concerned that she was told to stop her scopolamine eye drops but not told why. She is very concerned about being off of eye drops following recent eye procedure. Pt's dgt states she has tried numerous times to d/w Dr. Sherril Croon but cannot get in touch with him.  Spoke with Elease Hashimoto, LPN at [facility] who states when pt was admitted to the nursing center they used her list of meds from d/c at [facility]. She states they had to receive confirmation that these were correct from Dr. Sherril Croon and this was done over the phone. Notified Elease Hashimoto that Dr. Sherril Croon may need to look at these meds closer as the dgt has med questions regarding the eye drop, Lisinopril and Cardura.   Elease Hashimoto also states PT would like to know what limitations pt has regarding movement of her arm following pacer implantation. Notified her I would send a message to Dr. Johney Frame to determine what pt's limitations are regarding arm  usage.  Follow-up by: Cyril Loosen, RN, BSN,  May 19, 2010 1:09 PM  Additional Follow-up for Phone Call Additional follow up Details #1::        routine post op instructions no heavy lifting or rigorous activity with affected side for 6 weeks. Rodolph Bong to contact patient and review medicine list. It appears that coumadin was stopped due to falls 6/11.  IN hospital, the only change that I see was stop switch cardizem to 300mg  daily. She would benefit from PA visit to review medicines in detail with Gene Serpe. Additional Follow-up by: Hillis Range, MD,  May 24, 2010 9:19 AM    Additional Follow-up for Phone Call Additional follow up Details #2::    Discussed with pt's nurse Daphine Deutscher) at [facility]. Pt is taking Metoprolol 100mg  two times a day and is not on carvedilol. Pt is taking Diltiazem 180mg  two times a day. Instructions given regarding arm usage. Meds will be reviewed further at scheduled OV with Dr. Myrtis Ser on Friday, August 26th. This is first available for Gene or Dr. Myrtis Ser. Follow-up by: Cyril Loosen, RN, BSN,  May 24, 2010 10:25 AM

## 2010-11-16 NOTE — Consult Note (Signed)
Summary: [facility] CARDIOLOGY CONSULT  [facility] C[facility]IOLOG[facility] CONSULT   Imported By: Zachary George 02/16/2010 11:42:29  _____________________________________________________________________  External Attachment:    Type:   Image     Comment:   External Document

## 2010-11-16 NOTE — Assessment & Plan Note (Signed)
Summary: 6 WK F/U PER 7/6 OV-JM   Visit Type:  post hospitalization Primary Provider:  Dhruv Vyas,MD  CC:  atrial fibrillation.  History of Present Illness: The patient is seen post hospitalization.  She has a permanent pacemaker in place.  I saw her last April 21, 2010.  She has atrial fibrillation.  She had a significant fall.  She was insistent that there was no syncope or presyncope.  She fractured the orbit of her right eye.  This is improved.  We have chosen not to put her back on Coumadin at that point.  After I saw her in the office she later had more problems with falling and it became clear that she was weak with low heart rate.  She was admitted to [facility] and a permanent pacemaker was placed.  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  The pacemaker was placed May 10, 2010.  She is now doing well.  She's had no further spells.  Chest x-ray done in the hospital raised question of a small right upper lobe nodule.  Recommendation was made for followup PA and lateral chest x-ray.  This will be arranged. Patient had previously been on a diuretic before her hospitalization.  She is not on one now.  She is beginning to develop some edema.  Preventive Screening-Counseling & Management  Alcohol-Tobacco     Smoking Status: never  Current Medications (verified): 1)  Nitroglycerin 0.4 Mg Subl (Nitroglycerin) .... Place 1 Tablet Under Tongue As Directed 2)  Synthroid 100 Mcg Tabs (Levothyroxine Sodium) .... Take 1 Tablet By Mouth Once A Day 3)  Tylenol Pm Extra Strength 500-25 Mg Tabs (Diphenhydramine-Apap (Sleep)) .... Take 2 Tablet By Mouth Once A Day 4)  Calcium-Magnesium-Zinc 333-133-8.3 Mg Tabs (Calcium-Magnesium-Zinc) .... Take 1 Tablet By Mouth Two Times A Day 5)  Zyrtec Allergy 10 Mg Tabs (Cetirizine Hcl) .... Take 1 Tablet By Mouth Once A Day 6)  Hematinic/folic Acid 324-1 Mg Tabs (Ferrous Fumarate-Folic Acid) .... Take 1 Tablet By Mouth Once A Day 7)  Folic Acid 400  Mcg Tabs (Folic Acid) .... Take 1 Tablet By Mouth Once A Day 8)  Fish Oil 1200 Mg Caps (Omega-3 Fatty Acids) .... Take 1 Tablet By Mouth Two Times A Day 9)  Flax Seed Oil 1000 Mg Caps (Flaxseed (Linseed)) .... Take 1 Tablet By Mouth Two Times A Day 10)  Coq10 100 Mg Caps (Coenzyme Q10) .... Take 1 Tablet By Mouth Once A Day 11)  Vitamin C 500 Mg Tabs (Ascorbic Acid) .... Take 1 Tablet By Mouth Two Times A Day 12)  Vitamin E 400 Unit Caps (Vitamin E) .... Take 1 Tablet By Mouth Once A Day 13)  B-Caro-T 15 Mg Caps (Beta Carotene) .... Take 1 Tablet By Mouth Once A Day 14)  Vitamin D 400 Unit Caps (Cholecalciferol) .... Take 1 Tablet By Mouth Two Times A Day 15)  Zinc/copper 80mg  .... Take 2 Tablet By Mouth Once A Day 16)  Cardura 2 Mg Tabs (Doxazosin Mesylate) .... Take 1 Tablet By Mouth Once A Day 17)  Diltiazem Hcl Er Beads 180 Mg Xr24h-Cap (Diltiazem Hcl Er Beads) .... Take 1 Capsule By Mouth Two Times A Day For Htn 18)  Metoprolol Tartrate 100 Mg Tabs (Metoprolol Tartrate) .... Take 1 Tablet By Mouth Two Times A Day For Htn 19)  Hydrochlorothiazide 25 Mg Tabs (Hydrochlorothiazide) .... Take 1/2 Tablet By Mouth Once Daily For Htn 20)  Aspirin 81 Mg Tbec (Aspirin) .... Take  1 Tablet By Mouth Once Daily For Prevention of Strokes. 21)  Vitamin D3 1000 Unit Tabs (Cholecalciferol) .... Take 1 Tablet By Mouth Once A Day 22)  Oscal 500/200 D-3 500-200 Mg-Unit Tabs (Calcium-Vitamin D) .... Take 1 Tablet By Mouth Two Times A Day 23)  Benadryl 25 Mg Caps (Diphenhydramine Hcl) .... Take 1 Tablet By Mouth Once A Day As Needed 24)  Lasix 20 Mg Tabs (Furosemide) .... Take 1 Tablet By Mouth Once A Day  Allergies (verified): 1)  ! Darvocet 2)  ! Prednisone 3)  ! * Statins 4)  ! Lisinopril  Comments:  Nurse/Medical Assistant: The patient's medication list and allergies were reviewed with the patient and were updated in the Medication and Allergy Lists.  Past History:  Past Medical History: LV   good CAD    PCI for MI...2007 Carotid artery disease. 50% R. ICA stenosis, 50-70% LICA stenosis(November 2009) no significant change  /  Doppler December, 2010... less than 50% bilaterally. Peripheral arterial disease of the lower extremity. Renal mass   right.....biopsied and worked up in the past. Dyslipidemia. Hypertension  Hypothyroidism  Anemia    normocytic Atrial fibrillation..Dr. Ladona Ridgel 03/2008 felt symptoms could be controlled with as needed beta-blocker and follow    digoxin started Feb 26, 2010 for elevated heart rate  /  rate controlled Mar 09, 2010 /  PTVDP  05/10/2010 Coumadin Rx...Marland KitchenMarland Kitchenon hold by [facility] after fall....03/2010 carpal tunnel vertigo in the past. cervical degenerative disease. syncopal episode that is not yet completely explained.  It is possible that this could have been vasovagal.  EF   60-65%(September 02, 2008) MR   mild/mod Aortic valve thickening but no aortic stenosis Sinus bradycardia on Holter (November 2009) CVA.... while in the hospital February, 2011. Cough    May, 2011 ACE Inhibitor cough...resolved   Mar 09, 2010 Edema Mechanical fall with ruptured globe   [facility]  04/05/2010 Falling...recurrent...04/2010////   brady / tachy   PTVDP placed..05/10/2010 PTVDP    04/2010 Abnormal CXRay  04/2010...Marland Kitchenneeds f/u PA / Lat film Renal insufficiency  Review of Systems       Patient denies fever, chills, headache, sweats, rash, change in vision, change in hearing, chest pain, cough, nausea vomiting, urinary symptoms.  All other systems are reviewed and are negative.  Vital Signs:  Patient profile:   77 year old female Height:      62 inches Weight:      133 pounds Pulse rate:   65 / minute BP sitting:   150 / 94  (left arm) Cuff size:   regular  Vitals Entered By: Carlye Grippe (June 11, 2010 2:00 PM) CC: atrial fibrillation   Physical Exam  General:  patient looks excellent today. Head:  head is atraumatic. Eyes:  no xanthelasma. Neck:  no jugular  venous distention. Chest Wall:  pacemaker site is nicely healed. Lungs:  lungs are clear.  Respiratory effort is nonlabored. Heart:  cardiac exam reveals S1-S2.  No clicks or significant murmurs. Abdomen:  abdomen soft. Msk:  no musculoskeletal deformities. Extremities:  1+ peripheral edema in the ankles. Skin:  no skin rashes. Psych:  patient is oriented to person time and place.  Affect is normal.   PPM Specifications Following MD:  Sherryl Manges, MD     PPM Vendor:  [employer]     PPM Model Number:  [id number]     PPM Serial Number:  [id number] PPM DOI:  05/10/2010     PPM Implanting MD:  Sherryl Manges, MD  Lead 1    Location: RV     DOI: 05/10/2010     Model #: [id number]     Serial #: [id number]     Status: active  Magnet Response Rate:  BOL 85 ERI 65  Indications:  Tachy-brady syndrome   Parameters Mode:  VVI     Lower Rate Limit:  60     Upper Rate Limit:  130  Impression & Recommendations:  Problem # 1:  PACEMAKER, PERMANENT (ICD-V45.01)  Orders: EKG w/ Interpretation (93000) EKG is done today and reviewed by me.  The rhythm is paced.  Her new pacemaker is working well and looks good.  As part of evaluation today I reviewed all of her hospital records including the H&P and discharge summary and procedure notes.  Problem # 2:  * FALLING SPELLS Patient has had no further falling spells since her pacemaker.  I am hopeful that this is soft the problem.  Problem # 3:  COUMADIN THERAPY (ICD-V58.61) Patient continues to be off Coumadin.  This was stopped when she fell and hurt her eye.  Now that she has a pacemaker and she is much more stable I will continue to reconsider whether we should restart Coumadin.  I explained this to her.  Problem # 4:  EDEMA (ICD-782.3) The patient has had return of some edema.  We will restart her Lasix at 20 mg daily instead of 40. I will have her restart her low dose of potassium.  Chemistry will be checked.  Problem # 5:  ATRIAL FIBRILLATION  (ICD-427.31)  Her updated medication list for this problem includes:    Metoprolol Tartrate 100 Mg Tabs (Metoprolol tartrate) .Marland Kitchen... Take 1 tablet by mouth two times a day for htn    Aspirin 81 Mg Tbec (Aspirin) .Marland Kitchen... Take 1 tablet by mouth once daily for prevention of strokes.  Orders: EKG w/ Interpretation (93000) T-Basic Metabolic Panel (22025-42706) As there is underlying atrial fibrillation we must consider Coumadin in the future.  Problem # 6:  * ABNORMAL CHEST X-RAY Patient needs a followup PA and lateral chest x-ray to assess whether there is a right upper lobe nodule.  This will be scheduled.  Other Orders: T-Chest x-ray, 2 views (23762)  Patient Instructions: 1)  Chest x-ray 2)  Lab:  BMET  3)  Lasix 20mg  daily 4)  Hold Potassium 5)  Follow up in  8 weeks Prescriptions: LASIX 20 MG TABS (FUROSEMIDE) Take 1 tablet by mouth once a day  #30 x 6   Entered by:   Hoover Brunette, LPN   Authorized by:   Talitha Givens, MD, [facility]   Signed by:   Hoover Brunette, LPN on 83/15/1761   Method used:   Electronically to        [facility]* (retail)       [address]       [place], [place]  [address]       Ph: [phone]       Fax: [phone]   RxID:   [id number] METOPROLOL TARTRATE 100 MG TABS (METOPROLOL TARTRATE) Take 1 tablet by mouth two times a day for HTN  #60 x 6   Entered by:   Hoover Brunette, LPN   Authorized by:   Talitha Givens, MD, [facility]   Signed by:   Hoover Brunette, LPN on 81/10/7508   Method used:   Electronically to        [facility] (retail)  [address]       [place], [place]  [address]       Ph: [phone]       Fax: [phone]   RxID:   [id number] DILTIAZEM HCL ER BEADS 180 MG XR24H-CAP (DILTIAZEM HCL ER BEADS) Take 1 capsule by mouth two times a day for HTN  #60 x 6   Entered by:   Hoover Brunette, LPN   Authorized by:   Talitha Givens, MD, [facility]   Signed by:   Hoover Brunette, LPN on 84/13/2440   Method used:    Electronically to        [facility] (retail)       [address]       [place], [place]  [address]       Ph: [phone]       Fax: [phone]   RxID:   [id number]

## 2010-11-16 NOTE — Letter (Signed)
Summary: [facility] PROGRESS NOTE  DR. VYAS  [facility] PROGRESS NOTE  DR. VYAS   Imported By: Zachary George 02/25/2010 16:05:23  _____________________________________________________________________  External Attachment:    Type:   Image     Comment:   External Document

## 2010-11-16 NOTE — Cardiovascular Report (Signed)
Summary: Patient Implant Record Information   Patient Implant Record Information   Imported By: Roderic Ovens 06/07/2010 14:21:51  _____________________________________________________________________  External Attachment:    Type:   Image     Comment:   External Document

## 2010-11-16 NOTE — Miscellaneous (Signed)
  Clinical Lists Changes  Problems: Added new problem of * FALLING SPELLS Added new problem of PACEMAKER, PERMANENT (ICD-V45.01) Observations: Added new observation of PAST MED HX: LV  good CAD    PCI for MI...2007 Carotid artery disease. 50% R. ICA stenosis, 50-70% LICA stenosis(November 2009) no significant change  /  Doppler December, 2010... less than 50% bilaterally. Peripheral arterial disease of the lower extremity. Renal mass   right.....biopsied and worked up in the past. Dyslipidemia. Hypertension  Hypothyroidism  Anemia    normocytic Atrial fibrillation..Dr. Ladona Ridgel 03/2008 felt symptoms could be controlled with as needed beta-blocker and follow    digoxin started Feb 26, 2010 for elevated heart rate  /  rate controlled Mar 09, 2010 /  PTVDP  05/10/2010 Coumadin Rx...Marland KitchenMarland Kitchenon hold by [facility] fall....03/2010 carpal tunnel vertigo in the past. cervical degenerative disease. syncopal episode that is not yet completely explained.  It is possible that this could have been vasovagal.  EF   60-65%(September 02, 2008) MR   mild/mod Aortic valve thickening but no aortic stenosis Sinus bradycardia on Holter (November 2009) CVA.... while in the hospital February, 2011. Cough    May, 2011 ACE Inhibitor cough...resolved   Mar 09, 2010 Edema Mechanical fall with ruptured globe   [facility]  04/05/2010 Falling...recurrent...04/2010////   brady / tachy   PTVDP placed..05/10/2010 PTVDP    04/2010 Abnormal CXRay  04/2010...Marland Kitchenneeds f/u PA / Lat film Renal insufficiency (06/10/2010 12:44) Added new observation of PRIMARY MD: Dhruv Vyas,MD (06/10/2010 12:44)       Past History:  Past Medical History: LV  good CAD    PCI for MI...2007 Carotid artery disease. 50% R. ICA stenosis, 50-70% LICA stenosis(November 2009) no significant change  /  Doppler December, 2010... less than 50% bilaterally. Peripheral arterial disease of the lower extremity. Renal mass   right.....biopsied and worked up in the  past. Dyslipidemia. Hypertension  Hypothyroidism  Anemia    normocytic Atrial fibrillation..Dr. Ladona Ridgel 03/2008 felt symptoms could be controlled with as needed beta-blocker and follow    digoxin started Feb 26, 2010 for elevated heart rate  /  rate controlled Mar 09, 2010 /  PTVDP  05/10/2010 Coumadin Rx...Marland KitchenMarland Kitchenon hold by [facility] after fall....03/2010 carpal tunnel vertigo in the past. cervical degenerative disease. syncopal episode that is not yet completely explained.  It is possible that this could have been vasovagal.  EF   60-65%(September 02, 2008) MR   mild/mod Aortic valve thickening but no aortic stenosis Sinus bradycardia on Holter (November 2009) CVA.... while in the hospital February, 2011. Cough    May, 2011 ACE Inhibitor cough...resolved   Mar 09, 2010 Edema Mechanical fall with ruptured globe   [facility]  04/05/2010 Falling...recurrent...04/2010////   brady / tachy   PTVDP placed..05/10/2010 PTVDP    04/2010 Abnormal CXRay  04/2010...Marland Kitchenneeds f/u PA / Lat film Renal insufficiency

## 2010-11-16 NOTE — Progress Notes (Signed)
Summary: coumadin management  Phone Note Outgoing Call   Call placed by: Vashti Hey RN Call placed to: [name] @[place]  home [facility] Reason for Call: Discuss lab or test results Summary of Call: Received fax from [facility] with results of PT/INR obtained on pt 03/08/10.  PT 32.  INR 3.3  Order given for pt to decrease dose to 3mg  once daily except 1.5mg  on Mondays and Home Health to recheck INR on Tues. 03/16/10. Initial call taken by: Vashti Hey RN,  Mar 08, 2010 4:11 PM     Anticoagulant Therapy  Managed by: Vashti Hey RN PCP: Ottie Glazier Supervising MD: Dietrich Pates MD, Molly Maduro Indication 1: Atrial Fibrillation Indication 2: Cerebrovascular Accident Lab Used: [facility] Site: [place] PT 32.0  Dietary changes: no    Health status changes: no    Bleeding/hemorrhagic complications: no    Recent/future hospitalizations: no    Any changes in medication regimen? no    Recent/future dental: no  Any missed doses?: no       Is patient compliant with meds? yes         Anticoagulation Management History:      Her anticoagulation is being managed by telephone today.  Positive risk factors for bleeding include an age of 11 years or older.  The bleeding index is 'intermediate risk'.  Positive CHADS2 values include History of HTN and Age > 11 years old.  Her last INR was 2.6 and today's INR is 3.3.  Prothrombin time is 32.0.  Anticoagulation responsible provider: Dietrich Pates MD, Molly Maduro.    Anticoagulation Management Assessment/Plan:      The patient's current anticoagulation dose is Coumadin 3 mg tabs: Take 1 tablet by mouth once a day.  The target INR is 2.0-3.0.  The next INR is due 03/16/2010.  Anticoagulation instructions were given to MArtha @[facility]  [facility].  Results were reviewed/authorized by Vashti Hey RN.  She was notified by MArtha @[place] .         Prior Anticoagulation Instructions: Received fax from [facility] with  results of PT/INR obtained on pt 02/23/10.  PT 26.5  INR 2.6  Called pt to confirm pill strength and dosage.  Pt has 3mg  tablet and takes 3mg  once daily.  Told pt to continue coumadin 3mg  once daily and HH will recheck INR on 03/08/10.  This was also called to Angie at [facility] and for them to recheck INR on 03/08/10.  All verbalized understanding.  Current Anticoagulation Instructions: Received fax from [facility] with results of PT/INR obtained on pt 03/08/10.  PT 32.  INR 3.3  Order given for pt to decrease dose to 3mg  once daily except 1.5mg  on Mondays and Home Health to recheck INR on Tues. 03/16/10.

## 2010-11-16 NOTE — Progress Notes (Signed)
Summary: coumadin management  Phone Note Outgoing Call   Call placed by: Vashti Hey RN Call placed to: [facility] Reason for Call: Discuss lab or test results Summary of Call: Received fax with results of PT/INR obtained on pt 03/18/10.  PT 21.7  INR 2.1  Order given for pt to continue coumadin 3mg  once daily except 1.5mg  on Mondays.  Home Health to recheck INR on 04/01/10 and fax to office.   Initial call taken by: Vashti Hey RN,  March 19, 2010 1:25 PM     Anticoagulant Therapy  Managed by: Vashti Hey RN PCP: Ottie Glazier Supervising MD: Diona Browner MD, Remi Deter Indication 1: Atrial Fibrillation Indication 2: Cerebrovascular Accident Lab Used: [facility] Site: [place] PT 21.7  Dietary changes: no    Health status changes: no    Bleeding/hemorrhagic complications: no    Recent/future hospitalizations: no    Any changes in medication regimen? no    Recent/future dental: no  Any missed doses?: no       Is patient compliant with meds? yes         Anticoagulation Management History:      Her anticoagulation is being managed by telephone today.  Positive risk factors for bleeding include an age of 28 years or older.  The bleeding index is 'intermediate risk'.  Positive CHADS2 values include History of HTN and Age > 22 years old.  Her last INR was 3.3 and today's INR is 2.1.  Prothrombin time is 21.7.  Anticoagulation responsible provider: Diona Browner MD, Remi Deter.    Anticoagulation Management Assessment/Plan:      The patient's current anticoagulation dose is Coumadin 3 mg tabs: Take 1 tablet by mouth once a day.  The target INR is 2.0-3.0.  The next INR is due 04/01/2010.  Anticoagulation instructions were given to [facility].  Results were reviewed/authorized by Vashti Hey RN.  She was notified by Vashti Hey RN.         Prior Anticoagulation Instructions: Received fax from [facility] with results of PT/INR obtained on pt  03/08/10.  PT 32.  INR 3.3  Order given for pt to decrease dose to 3mg  once daily except 1.5mg  on Mondays and Home Health to recheck INR on Tues. 03/16/10.  Current Anticoagulation Instructions: Received fax with results of PT/INR obtained on pt 03/18/10.  PT 21.7  INR 2.1  Order given for pt to continue coumadin 3mg  once daily except 1.5mg  on Mondays.  Home Health to recheck INR on 04/01/10 and fax to office.

## 2010-11-18 NOTE — Assessment & Plan Note (Signed)
Summary: 3 month pacemaker ck   Visit Type:  Pacemaker check Primary Hong Moring:  Dhruv Vyas,MD   History of Present Illness: The patient presents today for routine electrophysiology followup. She reports doing very well since having her pacemaker implanted.  She denies pacemaker related complications.  The patient denies symptoms of palpitations, chest pain, shortness of breath, orthopnea, PND, lower extremity edema, dizziness, presyncope, syncope, or neurologic sequela. The patient is tolerating medications without difficulties and is otherwise without complaint today.   Preventive Screening-Counseling & Management  Alcohol-Tobacco     Smoking Status: never  Current Medications (verified): 1)  Nitroglycerin 0.4 Mg Subl (Nitroglycerin) .... Place 1 Tablet Under Tongue As Directed 2)  Synthroid 88 Mcg Tabs (Levothyroxine Sodium) .... Take 1 Tablet By Mouth Once A Day 3)  Tylenol Pm Extra Strength 500-25 Mg Tabs (Diphenhydramine-Apap (Sleep)) .... Take 1 Tablet By Mouth Once A Day 4)  Calcium-Magnesium-Zinc 333-133-8.3 Mg Tabs (Calcium-Magnesium-Zinc) .... Take 1 Tablet By Mouth Two Times A Day 5)  Zyrtec Allergy 10 Mg Tabs (Cetirizine Hcl) .... Take 1 Tablet By Mouth Once A Day(On Hold For 2 Weeks While Taking Allegra) 6)  Hematinic/folic Acid 324-1 Mg Tabs (Ferrous Fumarate-Folic Acid) .... Take 1 Tablet By Mouth Once A Day 7)  Folic Acid 400 Mcg Tabs (Folic Acid) .... Take 1 Tablet By Mouth Once A Day 8)  Fish Oil 1200 Mg Caps (Omega-3 Fatty Acids) .... Take 1 Tablet By Mouth Two Times A Day 9)  Flax Seed Oil 1000 Mg Caps (Flaxseed (Linseed)) .... Take 1 Tablet By Mouth Two Times A Day 10)  Coq10 100 Mg Caps (Coenzyme Q10) .... Take 1 Tablet By Mouth Once A Day 11)  Vitamin C 500 Mg Tabs (Ascorbic Acid) .... Take 1 Tablet By Mouth Two Times A Day 12)  Vitamin E 400 Unit Caps (Vitamin E) .... Take 1 Tablet By Mouth Once A Day 13)  B-Caro-T 15 Mg Caps (Beta Carotene) .... Take 1 Tablet By  Mouth Once A Day 14)  Vitamin D 400 Unit Caps (Cholecalciferol) .... Take 1 Tablet By Mouth Two Times A Day 15)  Zinc/copper 80mg  .... Take 2 Tablet By Mouth Once A Day 16)  Cardura 2 Mg Tabs (Doxazosin Mesylate) .... Take 1 Tablet By Mouth Once A Day 17)  Diltiazem Hcl Er Beads 180 Mg Xr24h-Cap (Diltiazem Hcl Er Beads) .... Take 1 Capsule By Mouth Two Times A Day For Htn 18)  Metoprolol Tartrate 100 Mg Tabs (Metoprolol Tartrate) .... Take 1 Tablet By Mouth Two Times A Day For Htn 19)  Hydrochlorothiazide 25 Mg Tabs (Hydrochlorothiazide) .... Take 1/2 Tablet By Mouth Once Daily For Htn 20)  Aspirin 81 Mg Tbec (Aspirin) .... Take 1 Tablet By Mouth Once Daily For Prevention of Strokes. 21)  Benadryl 25 Mg Caps (Diphenhydramine Hcl) .... Take 1 Tablet By Mouth Once A Day As Needed 22)  Lasix 20 Mg Tabs (Furosemide) .... Take 1 Tablet By Mouth Every Other Day 23)  Klor-Con M10 10 Meq Cr-Tabs (Potassium Chloride Crys Cr) .... Take 1 Tablet By Mouth Once A Day Only When Taking 40mg  Lasix Not With 20mg  24)  Sam-E Complete 400 Mg Tbec (S-Adenosylmethionine) .... Take 1 Tablet By Mouth Once A Day 25)  Allegra Allergy 180 Mg Tabs (Fexofenadine Hcl) .... Take 1 Tablet By Mouth Once A Day For 2 Weeks  Allergies (verified): 1)  ! Darvocet 2)  ! Prednisone 3)  ! * Statins 4)  ! Lisinopril 5)  ! Codeine  Comments:  Nurse/Medical Assistant: The patient's medication list and allergies were reviewed with the patient and were updated in the Medication and Allergy Lists.  Past History:  Past Medical History: Reviewed history from 06/11/2010 and no changes required. LV  good CAD    PCI for MI...2007 Carotid artery disease. 50% R. ICA stenosis, 50-70% LICA stenosis(November 2009) no significant change  /  Doppler December, 2010... less than 50% bilaterally. Peripheral arterial disease of the lower extremity. Renal mass   right.....biopsied and worked up in the past. Dyslipidemia. Hypertension    Hypothyroidism  Anemia    normocytic Atrial fibrillation..Dr. Ladona Ridgel 03/2008 felt symptoms could be controlled with as needed beta-blocker and follow    digoxin started Feb 26, 2010 for elevated heart rate  /  rate controlled Mar 09, 2010 /  PTVDP  05/10/2010 Coumadin Rx...Marland KitchenMarland Kitchenon hold by [facility] after fall....03/2010 carpal tunnel vertigo in the past. cervical degenerative disease. syncopal episode that is not yet completely explained.  It is possible that this could have been vasovagal.  EF   60-65%(September 02, 2008) MR   mild/mod Aortic valve thickening but no aortic stenosis Sinus bradycardia on Holter (November 2009) CVA.... while in the hospital February, 2011. Cough    May, 2011 ACE Inhibitor cough...resolved   Mar 09, 2010 Edema Mechanical fall with ruptured globe   [facility]  04/05/2010 Falling...recurrent...04/2010////   brady / tachy   PTVDP placed..05/10/2010 PTVDP    04/2010 Abnormal CXRay  04/2010...Marland Kitchenneeds f/u PA / Lat film Renal insufficiency  Social History: Reviewed history from 03/27/2009 and no changes required. She denies every smoking cigarettes. Denies alcohol   abuse. She is married and has three children. She is retired from   [employer]ultural [employer]. She lives in [place] outside of [place].      Review of Systems       All systems are reviewed and negative except as listed in the HPI.   Vital Signs:  Patient profile:   77 year old female Height:      62 inches Weight:      140 pounds Pulse rate:   61 / minute BP sitting:   127 / 82  (left arm) Cuff size:   regular  Vitals Entered By: Carlye Grippe (September 28, 2010 3:40 PM)  Physical Exam  General:  elderly female, NAD Head:  normocephalic and atraumatic Eyes:  PERRLA/EOM intact; conjunctiva and lids normal. Mouth:  Teeth, gums and palate normal. Oral mucosa normal. Neck:  supple Chest Wall:  pacemaker pocket is well healed Lungs:  Clear bilaterally to auscultation and percussion. Heart:  RRR  (paced) Abdomen:  Bowel sounds positive; abdomen soft and non-tender without masses, organomegaly, or hernias noted. No hepatosplenomegaly. Msk:  diffuse muscle atrophy Extremities:  No clubbing or cyanosis. Neurologic:  Alert and oriented x 3.   PPM Specifications Following MD:  Sherryl Manges, MD     PPM Vendor:  [employer]     PPM Model Number:  [id number]     PPM Serial Number:  [id number] PPM DOI:  05/10/2010     PPM Implanting MD:  Sherryl Manges, MD  Lead 1    Location: RV     DOI: 05/10/2010     Model #: [id number]     Serial #: [id number]     Status: active  Magnet Response Rate:  BOL 85 ERI 65  Indications:  Tachy-brady syndrome   PPM Follow Up Battery Voltage:  2.79 V     Battery Est. Longevity:  9.5 yrs  Right Ventricle  Amplitude: 22.40 mV, Impedance: 613 ohms, Threshold: 0.50 V at 0.40 msec  Episodes MS Episodes:  0     Ventricular High Rate:  4822     Ventricular Pacing:  37.4%  Parameters Mode:  VVI     Lower Rate Limit:  60     Upper Rate Limit:  130 Next Cardiology Appt Due:  04/18/2011 Tech Comments:  4822 VHR EPISODES--LONGEST WAS 5 MIN 58 SECONDS.  NORMAL DEVICE FUNCTION. NO CHANGES MADE. ROV IN [place] W/JA. Vella Kohler  September 28, 2010 4:07 PM MD Comments:  histogram reveals that heart rates are mostly controlled, though there is occasional RVR  Impression & Recommendations:  Problem # 1:  ATRIAL FIBRILLATION (ICD-427.31) stable coumadin is advised as pt is very high risk for stroke the patient is clear in her decision to decline coumadin I would be cautious with pradaxa or xarelto given her advanced age and renal failure  Problem # 2:  BRADYCARDIA (ICD-427.89) normal pacemaker function no changes  Problem # 3:  HYPERTENSION (ICD-401.9) stable  Patient Instructions: 1)  return in July 2012 to pacemaker clinic

## 2010-11-18 NOTE — Letter (Signed)
Summary: Pending TSH  [facility] [address]   [place], [place] [address]   Phone: [phone]  Fax: [phone]        October 13, 2010 MRN: [id number]    [facility] [address] [place], [place]  [address]    Dear Ms. Lips,    You were asked to have lab work done to check your TSH (thyroid hormone) 6 weeks following your October office visit. However, it does not appear this has been done yet.  Please, take the enclosed order to the [facility] at your earliest convenience to have this done. If you will not be able to do your lab work at this time, please notify our office so that we can properly document this in  your chart.       Sincerely,  Cyril Loosen, RN, BSN  This letter has been electronically signed by your physician.

## 2010-11-18 NOTE — Cardiovascular Report (Signed)
Summary: [facility]/ INTERROGATION REPORT  Card Device Clinic/ INTERROGATION REPORT   Imported By: Dorise Hiss 09/29/2010 09:22:41  _____________________________________________________________________  External Attachment:    Type:   Image     Comment:   External Document

## 2010-11-18 NOTE — Letter (Signed)
Summary: Engineer, materials at [facility]  [address]   [place], [place] [address]   Phone: [phone]  Fax: [phone]        November 09, 2010 MRN: [id number]    [facility] [address] [place], [place]  [address]    Dear Ms. Redner,  Your test ordered by Selena Batten has been reviewed by your physician (or physician assistant) and was found to be normal or stable. Your physician (or physician assistant) felt no changes were needed at this time.  ____ Echocardiogram  ____ Cardiac Stress Test  __X__ Lab Work  ____ Peripheral vascular study of arms, legs or neck  ____ CT scan or X-ray  ____ Lung or Breathing test  ____ Other:   Thank you.   Cyril Loosen, RN, BSN    Duane Boston, M.D., F.A.C.C. Thressa Sheller, M.D., F.A.C.C. Oneal Grout, M.D., F.A.C.C. Cheree Ditto, M.D., F.A.C.C. Daiva Nakayama, M.D., F.A.C.C. Kenney Houseman, M.D., F.A.C.C. Jeanne Ivan, PA-C

## 2010-11-19 NOTE — Miscellaneous (Signed)
Summary: Home Care Report/ [facility]  Home Care Report/ [facility]   Imported By: Dorise Hiss 03/23/2010 12:22:12  _____________________________________________________________________  External Attachment:    Type:   Image     Comment:   External Document  Appended Document: Home Care Report/ [facility] good  Appended Document: Home Care Report/ [facility] Patient informed of the above.

## 2010-12-21 ENCOUNTER — Encounter: Payer: Self-pay | Admitting: Cardiology

## 2010-12-21 ENCOUNTER — Ambulatory Visit (INDEPENDENT_AMBULATORY_CARE_PROVIDER_SITE_OTHER): Payer: Medicare Other | Admitting: Cardiology

## 2010-12-21 DIAGNOSIS — I4891 Unspecified atrial fibrillation: Secondary | ICD-10-CM

## 2010-12-28 NOTE — Assessment & Plan Note (Signed)
Summary: f50m/agh   Visit Type:  Follow-up Primary Provider:  Dhruv Vyas,MD  CC:  atrial fibrillation.  History of Present Illness: The patient is seen today for followup atrial fibrillation.  She actually looks quite good.  I saw her last in October, 2011.  Since that time she's been seen in followup for electrophysiology team.  They encouraged her to use Coumadin but she is hesitant.  Her pacemaker is working well.  Overall she is doing well.  She has not had any recurrent falls.  She is not having any marked shortness of breath.  Current Medications (verified): 1)  Nitroglycerin 0.4 Mg Subl (Nitroglycerin) .... Place 1 Tablet Under Tongue As Directed 2)  Synthroid 88 Mcg Tabs (Levothyroxine Sodium) .... Take 1 Tablet By Mouth Once A Day 3)  Tylenol Pm Extra Strength 500-25 Mg Tabs (Diphenhydramine-Apap (Sleep)) .... Take 1 Tablet By Mouth Once A Day 4)  Calcium-Magnesium-Zinc 333-133-8.3 Mg Tabs (Calcium-Magnesium-Zinc) .... Take 1 Tablet By Mouth Two Times A Day 5)  Zyrtec Allergy 10 Mg Tabs (Cetirizine Hcl) .... Take 1 Tablet By Mouth Once A Day(On Hold For 2 Weeks While Taking Allegra) 6)  Hematinic/folic Acid 324-1 Mg Tabs (Ferrous Fumarate-Folic Acid) .... Take 1 Tablet By Mouth Once A Day 7)  Folic Acid 400 Mcg Tabs (Folic Acid) .... Take 1 Tablet By Mouth Once A Day 8)  Fish Oil 1200 Mg Caps (Omega-3 Fatty Acids) .... Take 1 Tablet By Mouth Two Times A Day 9)  Flax Seed Oil 1000 Mg Caps (Flaxseed (Linseed)) .... Take 1 Tablet By Mouth Two Times A Day 10)  Coq10 100 Mg Caps (Coenzyme Q10) .... Take 1 Tablet By Mouth Once A Day 11)  Vitamin C 500 Mg Tabs (Ascorbic Acid) .... Take 1 Tablet By Mouth Two Times A Day 12)  Vitamin E 400 Unit Caps (Vitamin E) .... Take 1 Tablet By Mouth Once A Day 13)  B-Caro-T 15 Mg Caps (Beta Carotene) .... Take 1 Tablet By Mouth Once A Day 14)  Vitamin D 400 Unit Caps (Cholecalciferol) .... Take 1 Tablet By Mouth Two Times A Day 15)  Zinc/copper 80mg   .... Take 2 Tablet By Mouth Once A Day 16)  Cardura 2 Mg Tabs (Doxazosin Mesylate) .... Take 1 Tablet By Mouth Once A Day 17)  Diltiazem Hcl Er Beads 180 Mg Xr24h-Cap (Diltiazem Hcl Er Beads) .... Take 1 Capsule By Mouth Two Times A Day For Htn 18)  Metoprolol Tartrate 100 Mg Tabs (Metoprolol Tartrate) .... Take 1 Tablet By Mouth Two Times A Day For Htn 19)  Hydrochlorothiazide 25 Mg Tabs (Hydrochlorothiazide) .... Take 1/2 Tablet By Mouth Once Daily For Htn 20)  Aspirin 81 Mg Tbec (Aspirin) .... Take 1 Tablet By Mouth Once Daily For Prevention of Strokes. 21)  Benadryl 25 Mg Caps (Diphenhydramine Hcl) .... Take 1 Tablet By Mouth Once A Day As Needed 22)  Lasix 20 Mg Tabs (Furosemide) .... Take 1 Tablet By Mouth Every Other Day 23)  Klor-Con M10 10 Meq Cr-Tabs (Potassium Chloride Crys Cr) .... Take 1 Tablet By Mouth Once A Day Only When Taking 40mg  Lasix Not With 20mg  24)  Sam-E Complete 400 Mg Tbec (S-Adenosylmethionine) .... Take 1 Tablet By Mouth Once A Day  Allergies (verified): 1)  ! Darvocet 2)  ! Prednisone 3)  ! * Statins 4)  ! Lisinopril 5)  ! Codeine  Past History:  Past Medical History: LV  good CAD    PCI for MI...2007 Carotid artery disease.  50% R. ICA stenosis, 50-70% LICA stenosis(November 2009) no significant change  /  Doppler December, 2010... less than 50% bilaterally. Peripheral arterial disease of the lower extremity. Renal mass   right.....biopsied and worked up in the past. Dyslipidemia... Hypertension  Hypothyroidism  Anemia    normocytic Atrial fibrillation..Dr. Ladona Ridgel 03/2008 felt symptoms could be controlled with as needed beta-blocker and follow    digoxin started Feb 26, 2010 for elevated heart rate  /  rate controlled Mar 09, 2010 /  PTVDP  05/10/2010 Coumadin Rx...Marland KitchenMarland Kitchenon hold by [facility] after fall....03/2010 /  patient declined Coumadin restart December, 2011 carpal tunnel vertigo in the past. cervical degenerative disease. syncopal episode that is not yet  completely explained.  It is possible that this could have been vasovagal.  EF   60-65%(September 02, 2008) MR   mild/mod Aortic valve thickening but no aortic stenosis Sinus bradycardia on Holter (November 2009) CVA.... while in the hospital February, 2011. Cough    May, 2011 ACE Inhibitor cough...resolved   Mar 09, 2010 Edema Mechanical fall with ruptured globe   [facility]  04/05/2010 Falling...recurrent...04/2010////   brady / tachy   PTVDP placed..05/10/2010 PTVDP    04/2010 Abnormal CXRay  04/2010...Marland Kitchenneeds f/u PA / Lat film Renal insufficiency  Review of Systems       Patient denies fever, chills, headache, sweats, rash, change in vision, change in hearing, chest pain, cough, nausea vomiting, urinary symptoms.  All other systems are reviewed and are negative.  Vital Signs:  Patient profile:   77 year old female Height:      62 inches Weight:      139 pounds BMI:     25.52 Pulse rate:   90 / minute Resp:     18 per minute BP sitting:   85 / 56  (right arm)  Vitals Entered By: Marrion Coy, CNA (December 21, 2010 2:01 PM)  Physical Exam  General:  patient is stable. Eyes:  no xanthelasma. Neck:  no jugular venous distention. Lungs:  lungs are clear cardiorespiratory effort is not labored. Heart:  cardiac exam reveals S1 and S2.  The rhythm is irregularly irregular.  Rate is controlled. Abdomen:  abdomen soft. Extremities:  trace peripheral edema. Psych:  patient is oriented to person time and place.  Affect is normal.  She is here with her daughter.   PPM Specifications Following MD:  Sherryl Manges, MD     PPM Vendor:  [employer]     PPM Model Number:  [id number]     PPM Serial Number:  [id number] PPM DOI:  05/10/2010     PPM Implanting MD:  Sherryl Manges, MD  Lead 1    Location: RV     DOI: 05/10/2010     Model #: [id number]     Serial #: [id number]     Status: active  Magnet Response Rate:  BOL 85 ERI 65  Indications:  Tachy-brady syndrome   Parameters Mode:  VVI     Lower Rate  Limit:  60     Upper Rate Limit:  130  Impression & Recommendations:  Problem # 1:  PACEMAKER, PERMANENT (ICD-V45.01) The patient's pacemaker was 12 .therapy.  Problem # 2:  * FALLING SPELLS She has not had any recurrent falling spells.  No change in therapy.  Problem # 3:  COUMADIN THERAPY (ICD-V58.61) I discussed Coumadin with the patient again.  She has not fallen.  She is afraid of Coumadin or any other medications.  She and her daughter  are well informed.  She does not want to restart Coumadin.  Problem # 4:  EDEMA (ICD-782.3) There is no return of significant edema.  Problem # 5:  HYPOTHYROIDISM (ICD-244.9)  Her updated medication list for this problem includes:    Synthroid 88 Mcg Tabs (Levothyroxine sodium) .Marland Kitchen... Take 1 tablet by mouth once a day The patient's TSH has been followed up and the current dose of medicine is good for her.  No change in therapy.  Patient Instructions: 1)  Your physician wants you to follow-up in: 6 months. You will receive a reminder letter in the mail one-two months in advance. If you don't receive a letter, please call our office to schedule the follow-up appointment. 2)  Your physician recommends that you continue on your current medications as directed. Please refer to the Current Medication list given to you today.

## 2010-12-28 NOTE — Miscellaneous (Signed)
  Clinical Lists Changes  Observations: Added new observation of PAST MED HX: LV  good CAD    PCI for MI...2007 Carotid artery disease. 50% R. ICA stenosis, 50-70% LICA stenosis(November 2009) no significant change  /  Doppler December, 2010... less than 50% bilaterally. Peripheral arterial disease of the lower extremity. Renal mass   right.....biopsied and worked up in the past. Dyslipidemia. Hypertension  Hypothyroidism  Anemia    normocytic Atrial fibrillation..Dr. Ladona Ridgel 03/2008 felt symptoms could be controlled with as needed beta-blocker and follow    digoxin started Feb 26, 2010 for elevated heart rate  /  rate controlled Mar 09, 2010 /  PTVDP  05/10/2010 Coumadin Rx...Marland KitchenMarland Kitchenon hold by [facility] after fall....03/2010 /  patient declined Coumadin restart December, 2011 carpal tunnel vertigo in the past. cervical degenerative disease. syncopal episode that is not yet completely explained.  It is possible that this could have been vasovagal.  EF   60-65%(September 02, 2008) MR   mild/mod Aortic valve thickening but no aortic stenosis Sinus bradycardia on Holter (November 2009) CVA.... while in the hospital February, 2011. Cough    May, 2011 ACE Inhibitor cough...resolved   Mar 09, 2010 Edema Mechanical fall with ruptured globe   [facility]  04/05/2010 Falling...recurrent...04/2010////   brady / tachy   PTVDP placed..05/10/2010 PTVDP    04/2010 Abnormal CXRay  04/2010...Marland Kitchenneeds f/u PA / Lat film Renal insufficiency (12/21/2010 12:17) Added new observation of PRIMARY MD: Dhruv Vyas,MD (12/21/2010 12:17)       Past History:  Past Medical History: LV  good CAD    PCI for MI...2007 Carotid artery disease. 50% R. ICA stenosis, 50-70% LICA stenosis(November 2009) no significant change  /  Doppler December, 2010... less than 50% bilaterally. Peripheral arterial disease of the lower extremity. Renal mass   right.....biopsied and worked up in the past. Dyslipidemia. Hypertension  Hypothyroidism    Anemia    normocytic Atrial fibrillation..Dr. Ladona Ridgel 03/2008 felt symptoms could be controlled with as needed beta-blocker and follow    digoxin started Feb 26, 2010 for elevated heart rate  /  rate controlled Mar 09, 2010 /  PTVDP  05/10/2010 Coumadin Rx...Marland KitchenMarland Kitchenon hold by [facility] after fall....03/2010 /  patient declined Coumadin restart December, 2011 carpal tunnel vertigo in the past. cervical degenerative disease. syncopal episode that is not yet completely explained.  It is possible that this could have been vasovagal.  EF   60-65%(September 02, 2008) MR   mild/mod Aortic valve thickening but no aortic stenosis Sinus bradycardia on Holter (November 2009) CVA.... while in the hospital February, 2011. Cough    May, 2011 ACE Inhibitor cough...resolved   Mar 09, 2010 Edema Mechanical fall with ruptured globe   [facility]  04/05/2010 Falling...recurrent...04/2010////   brady / tachy   PTVDP placed..05/10/2010 PTVDP    04/2010 Abnormal CXRay  04/2010...Marland Kitchenneeds f/u PA / Lat film Renal insufficiency

## 2011-01-01 LAB — BASIC METABOLIC PANEL
CO2: 26 mEq/L (ref 19–32)
Calcium: 9.6 mg/dL (ref 8.4–10.5)
Calcium: 9.8 mg/dL (ref 8.4–10.5)
Creatinine, Ser: 2.28 mg/dL — ABNORMAL HIGH (ref 0.4–1.2)
GFR calc Af Amer: 25 mL/min — ABNORMAL LOW (ref >60)
GFR calc Af Amer: 33 mL/min — ABNORMAL LOW (ref >60)
GFR calc non Af Amer: 21 mL/min — ABNORMAL LOW (ref >60)
GFR calc non Af Amer: 27 mL/min — ABNORMAL LOW (ref >60)
Glucose, Bld: 95 mg/dL (ref 70–99)
Potassium: 3.9 mEq/L (ref 3.5–5.1)
Sodium: 136 mEq/L (ref 135–145)
Sodium: 141 mEq/L (ref 135–145)

## 2011-01-10 LAB — COMPREHENSIVE METABOLIC PANEL
ALT: 49 U/L — ABNORMAL HIGH (ref 0–35)
Alkaline Phosphatase: 90 U/L (ref 39–117)
BUN: 36 mg/dL — ABNORMAL HIGH (ref 6–23)
CO2: 25 mEq/L (ref 19–32)
Chloride: 101 mEq/L (ref 96–112)
Glucose, Bld: 94 mg/dL (ref 70–99)
Potassium: 4.5 mEq/L (ref 3.5–5.1)
Sodium: 133 mEq/L — ABNORMAL LOW (ref 135–145)
Total Bilirubin: 0.4 mg/dL (ref 0.3–1.2)
Total Protein: 6.1 g/dL (ref 6.0–8.3)

## 2011-01-10 LAB — CBC
HCT: 37.6 % (ref 36.0–46.0)
Hemoglobin: 12.8 g/dL (ref 12.0–15.0)
RBC: 4.07 MIL/uL (ref 3.87–5.11)
WBC: 7.1 10*3/uL (ref 4.0–10.5)

## 2011-01-10 LAB — PROTIME-INR
INR: 2.33 — ABNORMAL HIGH (ref 0.00–1.49)
INR: 2.38 — ABNORMAL HIGH (ref 0.00–1.49)
INR: 2.44 — ABNORMAL HIGH (ref 0.00–1.49)
INR: 2.46 — ABNORMAL HIGH (ref 0.00–1.49)
INR: 2.93 — ABNORMAL HIGH (ref 0.00–1.49)
INR: 3.06 — ABNORMAL HIGH (ref 0.00–1.49)
Prothrombin Time: 23.8 seconds — ABNORMAL HIGH (ref 11.6–15.2)
Prothrombin Time: 26.3 seconds — ABNORMAL HIGH (ref 11.6–15.2)
Prothrombin Time: 29.9 seconds — ABNORMAL HIGH (ref 11.6–15.2)
Prothrombin Time: 30.3 seconds — ABNORMAL HIGH (ref 11.6–15.2)
Prothrombin Time: 31.4 seconds — ABNORMAL HIGH (ref 11.6–15.2)
Prothrombin Time: 31.7 seconds — ABNORMAL HIGH (ref 11.6–15.2)

## 2011-01-10 LAB — DIFFERENTIAL
Basophils Absolute: 0 10*3/uL (ref 0.0–0.1)
Basophils Relative: 0 % (ref 0–1)
Eosinophils Absolute: 0.2 10*3/uL (ref 0.0–0.7)
Eosinophils Relative: 3 % (ref 0–5)
Monocytes Absolute: 0.5 10*3/uL (ref 0.1–1.0)

## 2011-03-01 NOTE — Assessment & Plan Note (Signed)
[facility]                          [facility] NOTE   BABETTA, PATERSON                       MRN:          [id number]  DATE:08/25/2008                            DOB:          [birth year]    Ms. Kunkler is seen today to follow up her overall cardiac status.  She  does have coronary artery disease.  She has significant peripheral  vascular disease.  She also has hypothyroidism and her thyroid meds had  been carefully adjusted by Dr. Gerhard Munch.  She has also had some atrial  fibrillation.  She has refused Coumadin in the past.   In the emergency room the EKG did show sinus rhythm with no acute  changes.   An additional problem is that of a recent syncopal episode.  I have  discussed this with her today.  She was at home talking with her husband  and felt dizzy and then had syncope.  She did bump her head.  Ultimately, she was taken to the emergency room.  CT scan of the head  revealed no marked abnormality.  It is my understanding that her rhythm  was stable.  Her meds were reviewed.  Potassium was 4.5, BUN 33, and  creatinine 1.5.  Hemoglobin was 10.6.  I do not see a TSH.  Her  situation stabilized and the ER was in touch with Dr. Gerhard Munch and  eventually it was decided that she could be discharged home with a  followup monitor that is to be placed tomorrow.  Since that time, she  has not had any difficulties.   It is of note that when she woke up after her episode, she was nauseated  and she vomited 3 separate times.  It is possible that this was a  vasovagal type of event related to nausea that she had primarily after  the event.  She is stable at this time.  She is not having any  significant chest pain.   PAST MEDICAL HISTORY:   ALLERGIES:  CODEINE, DARVOCET and she is intolerant to STATINS.   MEDICATIONS:  1. Lisinopril 10 or 20 b.i.d.  2. Flaxseed oil.  3. Calcium.  4. Vitamins.  5. Fish oil.  6. Carvedilol 12.5 b.i.d.  7. Diltiazem CD 120.  8. Fosamax.  9. Aspirin 81.  10.Synthroid 88 mcg.   OTHER MEDICAL PROBLEMS:  See the complete list below.   REVIEW OF SYSTEMS:  Today, she is actually feeling fairly well.  She  does have a bruise on her forehead.  We know that there was no fracture  from the evaluation in the emergency room.  She is not having any GI or  GU symptoms today.  She has no fevers or chills.  Otherwise, her review  of systems is negative.   PHYSICAL EXAMINATION:  VITAL SIGNS:  Blood pressure today 127/70 with a  pulse of 50.  Her weight is 138 pounds.  GENERAL:  The patient is oriented to person, time, and place.  She is  here with her family member.  Affect is normal.  HEENT:  No xanthelasma.  She has normal extraocular motion.  There is a  soft carotid bruit.  LUNGS:  Clear.  Respiratory effort is not labored.  CARDIAC:  S1 with an S2.  There are no significant murmurs.  ABDOMEN:  Soft.  EXTREMITIES:  She has no significant peripheral edema.   All of the data from the emergency room visit is reviewed completely.   PROBLEMS:  1. Coronary artery disease with an intervention in 2007, in the      setting of a myocardial infarction.  She is not been having chest      pain.  Of course her syncopal episode is concerning.  We need to      reassess her LV function to be sure that this is stable.  2. Carotid artery disease.  When I saw her in June 2009, I was      concerned about her overall status and we need to proceed with a      repeat carotid at this time.  3. Peripheral arterial disease of the lower extremity.  4. History of a right renal mass that was biopsied and worked up in      the past.  5. Dyslipidemia.  6. Hypertension treated.  7. Hypothyroidism treated.  8. History of normocytic anemia.  9. Episode of atrial fibrillation in June 2009.  10.Status post cataract surgery.  She hopes to have another cataract      operation soon, but I will put this on hold.  11.Status  post carpal tunnel and C-section.  12.History of some vertigo in the past.  13.History of cervical degenerative disease.  14.Recent syncopal episode that is not yet completely explained.  It      is possible that this could have been vasovagal.  However, we need      more complete evaluation.  I will arrange for 2-D echo.  The      patient is having a monitor that is being arranged through Dr.      Gerhard Munch.  The patient will have followup carotid Dopplers.  We will      put her cataract surgery on hold.  I will see her back to review      this data and look further into her overall status.     Luis Abed, MD, [facility]  Electronically Signed    JDK/MedQ  DD: 08/25/2008  DT: 08/26/2008  Job #: [id number]   cc:   Linward Foster

## 2011-03-01 NOTE — Assessment & Plan Note (Signed)
[facility]                          [facility]   NAME:HOPKINSLoriann, [name]                       MRN:          [id number]  DATE:03/04/2008                            DOB:          [birth year]    CARDIOLOGIST:  Dr. Willa Rough.   PRIMARY CARE PHYSICIAN:  Dr. Linward Foster.   REASON FOR VISIT:  6-week followup.   HISTORY OF PRESENT ILLNESS:  [name] is a 77 year old female patient  with a history of CAD who established with our practice in December  2008.  She was previously followed in [place], [place].  She was  last seen January 22, 2008.  Please see that extensive note for complete  details.  She returns to the office today for followup.   The patient complained at her last visit of some symptoms of  palpitations and shortness of breath in the middle of the night.  We set  up a Cardiolite study as well as an echocardiogram.  Her Cardiolite  showed normal LV perfusion.  Her echocardiogram revealed an EF of 60-  65%, mild concentric LVH, mild to moderate mitral regurgitation, and  mild pulmonary hypertension.   The patient also had multiple other issues ongoing when last seen.  She  had a recent ultrasound that showed a right renal mass.  She had a  biopsy in the past that was apparently benign.  It was felt no further  workup was necessary for this.  She also had complained of fatigue and  blood work was performed.  This was passed on to her primary care  physician.  Her primary care physician has changed her from Armour  Thyroid to Synthroid.  She also had iron studies performed secondary to  a normocytic anemia.  Her iron levels were low normal, B12 and folate  levels were normal.  She apparently had stool guaiacs with Dr. Gerhard Munch  that were normal.  She had fallen recently and complained of headaches.  She had an MRI performed of her brain done February 06, 2008.  This  revealed moderate cortical volume loss and chronic  ischemic  microvascular change.  There was a probable old infarct in the medial  portion the right cerebellar hemisphere.   The patient was also recently set up for re-look carotid Dopplers given  her cerebrovascular disease by Dr. Gerhard Munch.  I just pulled these results  up on the computer and this revealed less than 50% RICA stenosis and 50-  70% LICA stenosis.  Of note her carotid Dopplers performed in  [facility] January 2008 revealed a clear left carotid system and less  than 50% stenosis on the right.  Her history includes left carotid  endarterectomy in 2007.   In the office today, the patient is doing well.  She denies chest pain,  shortness breath, syncope or near-syncope.  She continues to note  fatigue.  This seems to be improving with increased activity.   CURRENT MEDICATIONS:  1. Hydrochlorothiazide 25 mg daily.  2. Carvedilol 12.5 mg b.i.d.  3. Synthroid 100 mcg daily.  4. Aspirin 81 mg daily.  5. Sular 34 mg daily.  6. Lisinopril 20 mg b.i.d.  7. Flax seed oil.  8. Calcium plus magnesium plus zinc.  9. Super B complex vitamin with vitamin C.  10.Folic acid.  11.Fish oil 1200 mg b.i.d.  12.Vitamin C.  13.Mood plus vitamin.   PHYSICAL EXAM:  She is a well-nourished well-developed female in no  acute distress.  Blood pressure is 107/62, pulse 76, weight 142.6 pounds.  HEENT is normal.  NECK:  Without JVD.  CARDIAC:  Normal S1, S2.  Regular rate and rhythm.  LUNGS:  Clear to auscultation bilaterally.  ABDOMEN:  Soft, nontender.  EXTREMITIES:  Without edema.  NEUROLOGIC:  She is alert and oriented x3.  Cranial nerves 2-12 grossly  intact.   IMPRESSION:  1. Coronary disease status post PCI in 2007 in the setting of      myocardial infarction.      a.     Records still pending.  2. Cerebrovascular disease status post left carotid endarterectomy in      2007.      a.     Recent carotid Dopplers with 50-70% left ICA stenosis and       less than 50% right ICA  stenosis.  3. Peripheral arterial disease status post left lower extremity      revascularization in July 2007.  4. History of right renal mass status post biopsy.  5. Dyslipidemia.      a.     Intolerant to all STATINS and ZETIA.  6. Hypertension.  7. History of thyroidectomy.      a.     Iatrogenic hypothyroidism.  8. Normocytic anemia.   PLAN:  1. The patient's carotid Dopplers were just performed recently.  It is      concerning that her left ICA stenosis has progressed from clear      by a study done in [place] in January 2008 to 50-70% stenosis      recently.  I discussed this with Dr. Myrtis Ser who also saw the patient.      She will need close followup on her carotid Dopplers given her      history.  We will set up for carotid Dopplers in 3 months' time.  2. No further cardiac testing is warranted at this time.  3. She will continue to follow up with Dr. Gerhard Munch for other medical      issues.  4. Will bring the patient back in 6 months to follow up Dr. Myrtis Ser or      sooner p.r.n.      Tereso Newcomer, PA-C  Electronically Signed      Luis Abed, MD, [facility]  Electronically Signed   SW/MedQ  DD: 03/04/2008  DT: 03/04/2008  Job #: [id number]   cc:   Linward Foster

## 2011-03-01 NOTE — Assessment & Plan Note (Signed)
[facility]                          [facility] NOTE   [name], [name]                       MRN:          [id number]  DATE:05/26/2009                            DOB:          [birth year]    [name] is seen for cardiac followup.  [name] has coronary artery  disease.  [name] had a stent in the past.  [name] has excellent LV function.  [name] has carotid artery disease.  [name] refuses Coumadin.  Recently, [name]  had an increase in her atrial fibrillation with brady-tachy.  [name] was  referred to Dr. Lewayne Bunting.  There is an excellent complete EMR note  in the chart dated March 30, 2009.  He reviewed the situation very  carefully.  He felt that her atrial fib symptoms could be controlled  with p.r.n. metoprolol.  He felt that her bradycardia was not  symptomatic enough to require a pacemaker.  He thought that [name] could be  watched for a period of time.   Since seeing him, [name] has been stable.  [name] does have some palpitations.  [name] takes metoprolol and [name] gets reasonable control.  [name] has not had  syncope or presyncope.  [name] is not having any chest pain.  There is no  heart failure.  [name] is going about full activities.   PAST MEDICAL HISTORY:   ALLERGIES:  CODEINE and DARVOCET and STATINS.   MEDICATIONS:  Carvedilol, Fosamax, aspirin, Synthroid, vitamins,  diltiazem, lisinopril, fish oil, and metoprolol on a p.r.n. basis.   OTHER MEDICAL PROBLEMS:  See the extensive list on the EMR note dated  March 30, 2009.   REVIEW OF SYSTEMS:  The patient denies any fevers, chills, skin rashes,  or sweats.  [name] denies any headache, change in vision, change in  hearing, chest pain, shortness of breath, cough, GI symptoms, or GU  symptoms.  All other systems are reviewed and are negative.   PHYSICAL EXAMINATION:  VITAL SIGNS:  Blood pressure is 143/89.  Pulse is  69.  GENERAL:  The patient is oriented to person, time, and place.  Affect is  normal.  [name] is  here with her sister today.  HEENT:  No xanthelasma.  [name] has some skin lesions on her head that have  been biopsied.  NECK:  There is no jugular venous distention.  LUNGS:  Clear.  Respiratory effort is not labored.  CARDIAC:  S1 and S2.  The rhythm is irregularly irregular.  ABDOMEN:  Soft.  There is no peripheral edema.   EKG reveals atrial fibrillation with a controlled ventricular response.   Problems are listed on the EMR note.  Her atrial fib is controlled with  p.r.n. beta-blocker.  Her coronary status is stable.  [name] is doing well.  Her carotid disease is stable.  I will see her back in  3 months to get a better feel on how [name] is feeling, and at that time we  will consider whether a followup carotid Doppler or other studies are  needed.     Luis Abed, MD, [facility]  Electronically Signed  JDK/MedQ  DD: 05/26/2009  DT: 05/27/2009  Job #: [id number]   cc:   Doreen Beam, MD

## 2011-03-01 NOTE — Assessment & Plan Note (Signed)
[facility]                          [facility]   NAME:Goon, SONAL DORWART                       MRN:          [id number]  DATE:10/01/2007                            DOB:          1929-07-21    REASON FOR REFERRAL:  Establish.   HISTORY OF PRESENT ILLNESS:  Ms. Strubel is a 77 year old female patient  with a history of prior coronary artery disease as well as peripheral  vascular disease who presents to the office today for establishment. She  had previously been followed by Dr. Georganna Skeans in [place] for her  coronary disease. She has apparently had myocardial infarction in 2007,  treated with stenting at [facility] as well as previous lower  extremity stenting for claudication and left carotid endarterectomy. All  of her procedures have been done at [facility].   The patient notes that over the last 2-3 months her blood pressure has  been more uncontrolled. It sounds as though she has had a fairly  elevated blood pressure in the past. However, over the last 2-3 months  it has definitely gone up. She has seen her primary care physician  several times and had her blood pressure medications adjusted. She tells  me 2-3 weeks ago, Dr. Burna Mortimer wanted to put her in the hospital in  [place], but she did not want to go. Her husband sees a primary  care physician in [place] and she wanted to establish with our clinic.   She denies any recent history of chest pain. She denies any exertional  chest heaviness or tightness. She denies any neck fullness or tachy  palpitations like she had with her previous myocardial infarction. She  denies any significant shortness of breath. She describes NYHA Class II  symptoms. She denies orthopnea, PND or pedal edema. She denies syncope  or near syncope. She has had some shooting pains across the right side  of her cranium in the parietal area. These are fairly non-sustained and  she denies any  significant headaches. Specifically, she has not had any  pain over her temple or tenderness to touch over her temple.   PAST MEDICAL HISTORY:  Records are currently pending.  1. Coronary artery disease, status post myocardial infarction in 2007,      treated with stenting at [facility] by a Dr. Erby Pian.  2. Cerebral vascular disease.      a.     Status post left carotid endarterectomy in 2007 at [facility].  3. Peripheral arterial disease.      a.     Status post left lower extremity stenting by Dr. Georganna Skeans       done at [facility]. Also, we think in 2007.  4. Hypertension.  5. Dyslipidemia.      a.     INTOLERANT TO ALL STATINS AND ZETIA.  6. History of goiter, status post thyroidectomy.      a.     Iatrogenic hypothyroidism on thyroid replacement.  7. Cataracts status post surgery on the left three weeks ago.  8. History of carpal  tunnel surgery.  9. History of C-section.  10.History of vertigo.  11.Cervical degenerative disc disease.  12.History of colonoscopy in 2008, presumably normal.   CURRENT MEDICATIONS:  1. Aspirin 81 mg daily.  2. Xibrom eye drops.  3. Vigamox eye drops.  4. Istalol eye drops.  5. Omni pred eye drops.  6. Sular 34 mg daily.  7. Lisinopril 20 mg b.i.d.  8. Metoprolol 100 mg b.i.d.  9. Flax seed oil 1000 mg daily.  10.Calcium.  11.Magnesium.  12.Zinc daily.  13.Super B complex with vitamin C daily.  14.Folic acid 400 mcg daily.  15.Magnesium with chelated zinc half tablet daily.  16.Fish oil 1200 mg b.i.d.  17.Vitamin C 500 mg b.i.d.  18.Garlique daily.  19.Mood plus vitamin 400 mg daily.  20.Armour thyroid 60 mg daily.  21.Hydrochlorothiazide 25 mg one-half tablet p.r.n.   ALLERGIES:  CODEINE, DARVOCET.   SOCIAL HISTORY:  She denies every smoking cigarettes. Denies alcohol  abuse. She is married and has three children. She is retired from  [employer]. She lives in [place] outside of [place].   FAMILY  HISTORY:  Her mother died from complications of heart failure at  age 10 and brother died of heart failure as well.   REVIEW OF SYSTEMS:  Please see HPI. She has some tingling in her right  upper extremity that is related to her degenerative disc disease. This  is related to position changes. She denies any fevers, chills, cough,  melena, hematochezia, hematuria or dysuria. Denies any monocular  blindness, unilateral weakness, difficulty with speech or facial droop.  She denies any claudication symptoms currently. She denies dysphagia,  odynophagia. Rest of the review of systems were negative.   PHYSICAL EXAMINATION:  She is a well-nourished, well-developed female in  no acute distress. Blood pressure 187/102 on the left, repeat blood  pressure by manual cuff by me 170/90 on the right and 174/86 on the  left. Weight 135 pounds. Pulse 70.  HEENT: Is normal.  NECK: Without JVD. She does have a prominent carotid pulse noted over  the right neck just superior to the clavicle.  ENDOCRINE: With some slight enlargement of the right lobe of the  thyroid.  LYMPH: No cervical adenopathy noted.  CARDIAC: Normal S1, S2, regular rate and rhythm without murmur.  LUNGS:  Are clear to auscultation bilaterally without wheezing, rhonchi  or rales.  ABDOMEN: Soft and nontender with normoactive bowel sounds. No  organomegaly. No abdominal bruits noted.  EXTREMITIES: Without edema. Calves are soft and nontender.  SKIN: Is warm and dry.  NEUROLOGIC: She is alert and oriented x3. Cranial nerves II-XII are  grossly intact.  VASCULAR: Examination without carotid bruits bilaterally. Left CEA scar  noted. Dorsalis pedis and posterior tibialis pulses are diminished  bilaterally. Femoral arteries are 1+ bilaterally without bruits noted.   ELECTROCARDIOGRAM: Reveals sinus rhythm with a heart rate of 66, left  axis deviation. No acute changes.   IMPRESSION:  1. Uncontrolled hypertension.  2. Coronary artery  disease, status post myocardial infarction in 2007,      treated at [facility].      a.     Records pending.  3. Cerebral vascular disease status post left carotid endarterectomy      in 2007 at [facility].      a.     Records pending.  4. Peripheral arterial disease status post left lower extremity      stenting.      a.  Records pending.  5. History of partial thyroidectomy secondary to goiter.      a.     Iatrogenic hypothyroidism.  6. Dyslipidemia.      a.     INTOLERANCE TO STATINS AND ZETIA.  7. Prominent carotid pulse over the right neck.  8. Cervical degenerative disc disease.  9. Atypical headaches.   PLAN:  The patient presents to the office today for establishment. She  has previously been followed by Dr. Georganna Skeans in [place]. From a  cardiac standpoint, she seems to be stable. Although her blood pressure  is uncontrolled. She was also interviewed and examined by Dr. Myrtis Ser  today. At this point in time we plan:  1. Obtain records from Dr. Georganna Skeans in [place] as well as Dr.      Burna Mortimer as well as [facility].  2. She is not interested in taking hydrochlorothiazide on a daily      basis for unknown reasons. Therefore, after some discussion, I have      decided to change her metoprolol to carvedilol 12.5 mg b.i.d to      attain better BP control.  3. She will need a blood pressure check in a week with the nurse. If      she cannot travel to [place], then I have asked that she get it done      at her primary care physician's office and have the results called      to [place].  4. She will get a BMET today.  5. We will set her up for a renal arterial ultrasound to rule out      significant renal artery stenosis as the cause for her uncontrolled      hypertension.  6. She prefers to establish with Dr. Andee Lineman. Will have her followup      with Dr. Andee Lineman in the next four weeks.      Tereso Newcomer, PA-C  Electronically Signed       Luis Abed, MD, [facility]  Electronically Signed   SW/MedQ  DD: 10/01/2007  DT: 10/01/2007  Job #: [id number]   cc:   Arma Heading, MD

## 2011-03-01 NOTE — Assessment & Plan Note (Signed)
[facility]                          [facility] NOTE   MAMYE, BOLDS                       MRN:          [id number]  DATE:01/22/2008                            DOB:          Apr 30, 1929    PRIMARY CARE PHYSICIAN:  Dr. Linward Foster.   REASON FOR VISIT:  46-month followup.   HISTORY OF PRESENT ILLNESS:  Ms. Volpi is a 77 year old female patient  with a history of coronary disease who established with our practice in  December of 2008.  At that point in time records were pending.  She had  had a history of myocardial infarction in 2007 treated with stenting at  [facility].  She also had a history of peripheral arterial disease  as well as cerebrovascular disease.  We requested her records.  We also  set her up for a renal arterial ultrasound as she did have  uncontrollable hypertension and a significant history of vascular  disease.   We have received some of her records.  The cardiac catheterization done  the setting of her myocardial infarction has yet to arrive.  She did  have follow-up carotid Dopplers done October 30, 2006.  She had less  than 50% ICA stenosis on the right and the left system appeared to be  clear.  She was status post left carotid endarterectomy.   Lower extremity arteriogram done July 2007 revealed significant disease  on the left.  She underwent successful angioplasty and stenting of the  left trifurcation as well as angioplasty and stenting of the left  superficial femoral artery and proximal left popliteal artery.  At that  time she had a 40-50% proximal right renal artery stenosis.   Since being seen, the patient presented to [facility] with a  fall.  She broke her right wrist.  She hit her head and received a  laceration that was apparently stapled.  Reviewing the records, it does  not appear she had any scan done of her head.   The patient had her renal arterial ultrasound done in  February of 2009  that did reveal a 1-59% ostial right renal artery stenosis.  Her left  renal artery was normal.  She did have a 3.3 cm cyst on the right and 2  masses of unknown etiology noted.  The patient brings in some paperwork  today from Dr. Cheryln Manly in [place], [place], who is a  urologist.  She apparently had ultrasound done there a couple of weeks  ago that showed a large right renal mass, a possible small left cyst.  She was told to follow up in 6 months.  She is currently requesting  another urologist to see her locally.   The patient also notes a history of palpitations and shortness of breath  occurring in the middle of the night a couple of weeks ago.  She  apparently called her daughter and took nitroglycerin.  She cannot  really remember much about this episode.  She denies any chest pain.  She denies any syncope.  She denied any near syncope when she fell.  She  denies orthopnea, PND or pedal edema.  She does note that she has been  quite tired recently.  She thinks her thyroid levels are off.  She  recently established with Dr. Gerhard Munch but did not discuss this with him.  She also failed to discuss the possibility of a right renal mass with  him.  She tells me she has had poor memory since she fell and hit her  head.  However, she is having headaches on the right side in the  parietal region.  She thinks it may be related to her spectacles.   MEDICATIONS:  HCTZ 25 mg a day, Armour Thyroid 60 mg daily, carvedilol  12.5 mg b.i.d., Sular 34 mg daily, lisinopril 20 mg b.i.d., flaxseed  oil, calcium plus magnesium plus zinc, super B complex, folic acid, fish  oil, vitamin C.   PHYSICAL EXAM:  She is a well-nourished, well-developed female.  Blood pressure is 158/89, pulse 55, weight 141.2 pounds.  HEENT:  Normal.  NECK:  Without JVD.  CARDIAC:  Normal S1, S2.  Regular rate and rhythm.  LUNGS:  Clear to auscultation bilaterally.  ABDOMEN:  Soft, nontender.   EXTREMITIES:  Without edema.  NEUROLOGIC:  She is alert and oriented x3.  Cranial nerves 2-12 grossly  intact.   IMPRESSION:  1. Coronary artery disease.      a.     Myocardial infarction in 2007 treated with stenting -       records unavailable.  2. Cerebrovascular disease.  Status post left carotid endarterectomy      in 2007.  3. Peripheral arterial disease, status post stenting of the left      trifurcation and left superficial femoral arteries in July of 2007.  4. Recent history of palpitations and dyspnea.      a.     Question anxiety.      b.     Rule out ischemia.  5. Headaches.  6. Right renal mass.      a.     History of right renal biopsy - results unknown.      b.     History of pneumothorax in the setting of right renal biopsy       in 2007.  7. Dyslipidemia - intolerant to all statins and Zetia.  8. Hypertension, better control.  9. History of goiter, status post thyroidectomy.      a.     Iatrogenic hypothyroidism.  10.Fatigue.   DISCUSSION:  Ms. Heitzenrater presents to the office today for followup.  She  did fall recently and suffered a wrist fracture.  She apparently hit her  head and has had some headaches since then.  Although her headaches are  in different location of her scalp.  She has also had some memory loss.  She does have a right renal mass noted on ultrasounds recently.  She has  had no significant renal artery stenosis contributing to her  hypertension.  Her hypertension does look better.  She did have an  episode of dyspnea, palpitations some weeks ago.  After further  discussion with Dr. Myrtis Ser, who also spoke to the patient, it seems like  this episode may have been related to anxiety.  Her anginal symptoms in  the past seemed to be more of a jaw discomfort.  As noted above, we  still do not have records of her prior cardiac catheterization.   PLAN:  1. Proceed with adenosine Cardiolite to rule out ischemia.  2. Proceed  with 2D echocardiogram to  reassess her LV function and      structure.  3. Check labs to further assess her fatigue with a BMET, CBC and TSH.  4. I have requested that she see Dr. Gerhard Munch back in followup for her      right renal mass and her headaches and memory loss since her fall.      We will try to arrange followup with him in the next several days      to address these issues.  5. She will be provided with p.r.n. nitroglycerin and she knows to      take this only when she has her anginal symptoms.  6. We will have the patient return in the next 4-6 weeks for followup      on the above.      Tereso Newcomer, PA-C  Electronically Signed      Luis Abed, MD, [facility]  Electronically Signed   SW/MedQ  DD: 01/22/2008  DT: 01/22/2008  Job #: [id number]   cc:   Linward Foster

## 2011-03-01 NOTE — Assessment & Plan Note (Signed)
[facility]                          [facility]   YU, CRAGUN                       MRN:          [id number]  DATE:04/15/2008                            DOB:          1929-07-09    Ms. Brizzi is seen for cardiology followup.  I had seen her last in the  office on Mar 04, 2008.  She was stable at that time.  We had mentioned  very careful followup of her carotid Dopplers because there had been a  change.  In the meantime, she was admitted to [facility] with a  bout of atrial fibrillation.  She converted spontaneously.  She was  treated with Cardizem.  Her Sular was changed to oral Cardizem.  It was  noted that her TSH was low.  Dr. Elonda Husky, of the hospitalist team,  suggested holding her Synthroid dose of 100 mcg for a few days, and then  resuming a new dose of 88 mcg.  The patient has held her Synthroid.  She  has not had the new prescription filled.  Final decision about this will  be made with Dr. Concepcion Elk tomorrow.   The patient has known coronary disease post PCI in 2007.  The note  reflects that her recent Cardiolite showed no ischemia, and her recent  echo showed an ejection fraction of 60%-65% with mild LVH.  She does  have mild-to-moderate mitral regurgitation and mild pulmonary  hypertension.   She is not having any significant chest pain or shortness of breath at  this time.   PAST MEDICAL HISTORY/ALLERGIES:  GI upset from CODEINE.  Intolerant to  STATINS.  She does not tolerate DARVOCET.   MEDICATIONS:  1. Hydrochlorothiazide 25.  2. Carvedilol 12.5 b.i.d.  3. Synthroid (previously 100 mcg.  It is currently on hold.)  She will      need a resumption of the newer dose.  4. Diltiazem CD 120.  5. Alendronate 70 mg weekly.  6. Lisinopril 20.  7. Vitamins.  8. Fish oil.   OTHER MEDICAL PROBLEMS:  See the list below.   REVIEW OF SYSTEMS:  Today, she is feeling well.  She is actually having  no  significant complaints.  She has had no significant return of  palpitations.  Otherwise review of systems is negative.   PHYSICAL EXAM:  Blood pressure today is 139/80 with a pulse of 58.  The  patient is oriented to person, time, and place.  Affect is normal.  She  is here with one of her sisters today.  HEENT:  Reveals no xanthelasma.  She has normal extraocular motion.  There are no carotid bruits.  There is no jugular venous distention.  LUNGS:  Clear.  Respiratory effort is not labored.  CARDIAC EXAM:  Reveals an S1 with an S2.  There are no clicks or  significant murmurs.  ABDOMEN:  Soft.  She has no significant peripheral edema.   EKG:  Reveals that she is holding sinus rhythm with mild sinus  bradycardia today.  She has increased voltage compatible with left  ventricular hypertrophy.  PROBLEMS INCLUDE:  1. Coronary disease post percutaneous coronary intervention in 2007 in      the setting of an myocardial infarction.  She is stable.  2. Cerebrovascular disease post left carotid endarterectomy in 2007.      Recent Dopplers showed 50%-70% left internal carotid stenosis, and      a less than 50% right internal carotid stenosis.  It is concerning      that the left carotid has progressed seemingly fairly rapidly.  On      this basis, she will have an early followup carotid Doppler.  3. Peripheral arterial disease status post left lower extremity      revascularization in 2007.  4. History of right renal mass.  It is my understanding that this has      been biopsied and fully worked up.  5. Dyslipidemia.  The patient is intolerant to statins and Zetia.  6. Hypertension treated.  7. History of thyroidectomy and hypothyroidism.  Most recently on 100      mcg of Synthroid, her TSH was low, and it has been held for several      days.  Dr. Concepcion Elk will get this note, and he will decide the      resumption of the dosing of her Synthroid.  8. History of normocytic anemia.  9.  Recent hospitalization with atrial fibrillation.  Certainly, she      would have an increased likelihood of having this at a time when      her TSH was low.  Therefore, it may not be necessary to worry about      any further treatment going forward.  There was a question raised      of using Coumadin.  At this point, I would not push for that      because of the possibility that her thyroid status may have      affected her rhythm.  Also, she flatly refuses Coumadin anyway.  10.Status past cataract surgery  11.Status post carpal tunnel and C-section.  12.History of some vertigo.  13.Cervical degenerative disease.   She will need resumption of her Synthroid.  I have not started that  today and she will see Dr. Concepcion Elk tomorrow.  We will be sure that her  followup carotid Dopplers are arranged.  We will see her for cardiology  followup in 3 months.     Luis Abed, MD, [facility]  Electronically Signed    JDK/MedQ  DD: 04/15/2008  DT: 04/15/2008  Job #: [id number]   cc:   Linward Foster

## 2011-03-01 NOTE — Assessment & Plan Note (Signed)
[facility]                          [facility]   CHONG, JANUARY                       MRN:          [id number]  DATE:09/15/2008                            DOB:          05-Jun-1929    [name] is seen for followup.  I saw her on August 25, 2008.  We  were worried about her syncopal episode.  She had a 2-D echo revealing  excellent LV function.  Her monitor reveals sinus brady.  She did not  have any marked brady.  We need to obtain the actual strips from her  monitors.  Her carotid Doppler showed that she has a 50-70% left  internal carotid artery stenosis that shows no significant change.  She  is feeling better and not having any recurrent symptoms.   PAST MEDICAL HISTORY:   ALLERGIES:  See the flow sheet.   MEDICATIONS:  See the flow sheet including carvedilol 12.5 b.i.d. (to be  reduced to 6.25 b.i.d.).   OTHER MEDICAL PROBLEMS:  See the complete list on the note of August 25, 2008.   REVIEW OF SYSTEMS:  She has no fevers or chills.  She has no skin  rashes.  She has no GI or GU symptoms.  Her review of systems is  otherwise negative.   PHYSICAL EXAMINATION:  VITAL SIGNS:  Blood pressure is 118/69 with a  pulse of 54.  GENERAL:  The patient is oriented to person, time, and place.  Affect is  normal.  HEENT:  No xanthelasma.  She has normal extraocular motion.  NECK:  There is a left carotid bruit.  She has no jugular venous  distention.  LUNGS:  Clear.  Respiratory effort is not labored.  CARDIAC:  An S1 with an S2.  There are no clicks or significant murmurs.  ABDOMEN:  Soft.  EXTREMITIES:  She has no peripheral edema.   Problems are listed on my note of August 25, 2008.  #1.  Coronary disease, stable.  #2.  Carotid disease, stable with her Doppler.  She has 50-70% left  internal carotid artery stenosis and she will need followup in a year.  #14.  Recent syncopal episode.  Echo was normal.  Monitor shows  only  mild sinus bradycardia.  We will obtain the strips.  Her carotid disease is stable.  She is  stable.  She is cleared to have her cataract surgery.     Luis Abed, MD, [facility]  Electronically Signed    JDK/MedQ  DD: 09/15/2008  DT: 09/16/2008  Job #: [id number]   cc:   Linward Foster

## 2011-03-01 NOTE — Assessment & Plan Note (Signed)
[facility]                          [facility]ICE NOTE   GREENLEE, ANCHETA                       MRN:          [id number]  DATE:02/20/2009                            DOB:          [birth year]    PRIMARY CARDIOLOGIST:  Luis Abed, MD, [facility]   REASON FOR VISIT:  Scheduled followup.   HISTORY OF PRESENT ILLNESS:  Since her last visit with [place] in November of  last year, Ms. Maglione has been doing quite well from a cardiac  standpoint.  With respect to chest pain, she states that she has never  had any, and she has not developed any symptoms recently suggestive of  unstable angina pectoris.   Ms. Hocutt does have history of paroxysmal atrial fibrillation, and  reported an episode several nights ago where she felt something  flutter which lasted approximately 25 minutes in duration.  However,  there were no significant associated symptoms.   The subject of treatment with Coumadin anticoagulation has been broached  in the past.  Ms. Willis made it quite clear to me today that she has  adamantly refused this in the past.  She is on low-dose aspirin.   REVIEW OF SYSTEMS:  The patient denies any dizziness or recurrent  syncope, since her last visit.   CURRENT MEDICATIONS:  Carvedilol 12.5 b.i.d., diltiazem CD 180 daily,  aspirin 81 daily, Synthroid 0.088 daily.   PHYSICAL EXAMINATION:  VITAL SIGNS:  Blood pressure 130/78, pulse 60 and  regular, weight 136.  GENERAL:  A 77 year old female sitting upright in no distress.  HEENT:  Normocephalic, atraumatic.  NECK:  Palpable carotid pulses without bruits.  Well-healed left-sided  incision.  LUNGS:  Clear to auscultation in all fields.  HEART:  Regular rate and rhythm.  No significant murmurs.  ABDOMEN:  Soft, nontender.  EXTREMITIES:  Trace edema on the left, no edema on the right.  NEUROLOGIC:  Alert and oriented.   IMPRESSION:  1. Coronary artery disease.      a.     Status post  reported myocardial infarction in 2007, treated       with coronary artery stenting [facility], [place]).      b.     Normal stress Cardiolite; EF 70%, April 2009.  2. Cerebrovascular disease.      a.     Stable 50 - 70% left ICA stenosis, November 2009.  3. History of paroxysmal atrial fibrillation.      a.     Previously refused Coumadin.  4. Hypertension.  5. Dyslipidemia.      a.     Statin intolerant.  6. Hypothyroidism.  7. History of syncope.  8. Peripheral arterial disease.      a.     History of lower extremity stenting in 2007.  9. Normocytic anemia.  10.Chronic renal insufficiency.   PLAN:  1. Continue current medication regimen.  2. Continue aggressive lipid management with target LDL of 70 or less,      if feasible.  It is noted, however, that the patient is intolerant      to  all statins.  Nevertheless, her recent lipid profile indicate a      poorly controlled hyperlipidemia with an LDL of 217 and the total      cholesterol 303.  She is currently on fish oil and we will need to      evaluate this further, at time of her return followup.  3. Schedule return clinic visit with myself and Dr. Myrtis Ser in 3 months.      Rozell Searing, PA-C  Electronically Signed      Luis Abed, MD, [facility]  Electronically Signed   GS/MedQ  DD: 02/20/2009  DT: 02/21/2009  Job #: [id number]   cc:   Linward Foster

## 2011-05-31 ENCOUNTER — Encounter: Payer: Self-pay | Admitting: *Deleted

## 2011-07-26 ENCOUNTER — Encounter: Payer: Self-pay | Admitting: Internal Medicine

## 2011-07-29 ENCOUNTER — Encounter: Payer: Self-pay | Admitting: Cardiology

## 2011-07-29 DIAGNOSIS — I2510 Atherosclerotic heart disease of native coronary artery without angina pectoris: Secondary | ICD-10-CM | POA: Insufficient documentation

## 2011-07-29 DIAGNOSIS — N2889 Other specified disorders of kidney and ureter: Secondary | ICD-10-CM | POA: Insufficient documentation

## 2011-07-29 DIAGNOSIS — E785 Hyperlipidemia, unspecified: Secondary | ICD-10-CM | POA: Insufficient documentation

## 2011-07-29 DIAGNOSIS — Z87898 Personal history of other specified conditions: Secondary | ICD-10-CM | POA: Insufficient documentation

## 2011-07-29 DIAGNOSIS — T464X5A Adverse effect of angiotensin-converting-enzyme inhibitors, initial encounter: Secondary | ICD-10-CM

## 2011-07-29 DIAGNOSIS — I495 Sick sinus syndrome: Secondary | ICD-10-CM | POA: Insufficient documentation

## 2011-07-29 DIAGNOSIS — R05 Cough: Secondary | ICD-10-CM | POA: Insufficient documentation

## 2011-07-29 DIAGNOSIS — R55 Syncope and collapse: Secondary | ICD-10-CM | POA: Insufficient documentation

## 2011-07-29 DIAGNOSIS — E039 Hypothyroidism, unspecified: Secondary | ICD-10-CM | POA: Insufficient documentation

## 2011-07-29 DIAGNOSIS — I739 Peripheral vascular disease, unspecified: Secondary | ICD-10-CM | POA: Insufficient documentation

## 2011-07-29 DIAGNOSIS — I358 Other nonrheumatic aortic valve disorders: Secondary | ICD-10-CM | POA: Insufficient documentation

## 2011-07-29 DIAGNOSIS — R609 Edema, unspecified: Secondary | ICD-10-CM | POA: Insufficient documentation

## 2011-07-29 DIAGNOSIS — R9430 Abnormal result of cardiovascular function study, unspecified: Secondary | ICD-10-CM | POA: Insufficient documentation

## 2011-07-29 DIAGNOSIS — I10 Essential (primary) hypertension: Secondary | ICD-10-CM | POA: Insufficient documentation

## 2011-07-29 DIAGNOSIS — Z7901 Long term (current) use of anticoagulants: Secondary | ICD-10-CM | POA: Insufficient documentation

## 2011-07-29 DIAGNOSIS — I340 Nonrheumatic mitral (valve) insufficiency: Secondary | ICD-10-CM | POA: Insufficient documentation

## 2011-07-29 DIAGNOSIS — N189 Chronic kidney disease, unspecified: Secondary | ICD-10-CM | POA: Insufficient documentation

## 2011-07-29 DIAGNOSIS — G5600 Carpal tunnel syndrome, unspecified upper limb: Secondary | ICD-10-CM | POA: Insufficient documentation

## 2011-07-29 DIAGNOSIS — D649 Anemia, unspecified: Secondary | ICD-10-CM | POA: Insufficient documentation

## 2011-07-29 DIAGNOSIS — I4891 Unspecified atrial fibrillation: Secondary | ICD-10-CM | POA: Insufficient documentation

## 2011-08-01 ENCOUNTER — Encounter: Payer: Self-pay | Admitting: Cardiology

## 2011-08-01 ENCOUNTER — Ambulatory Visit (INDEPENDENT_AMBULATORY_CARE_PROVIDER_SITE_OTHER): Payer: Medicare Other | Admitting: Cardiology

## 2011-08-01 DIAGNOSIS — Z7901 Long term (current) use of anticoagulants: Secondary | ICD-10-CM

## 2011-08-01 DIAGNOSIS — R9389 Abnormal findings on diagnostic imaging of other specified body structures: Secondary | ICD-10-CM | POA: Insufficient documentation

## 2011-08-01 DIAGNOSIS — I4891 Unspecified atrial fibrillation: Secondary | ICD-10-CM

## 2011-08-01 NOTE — Patient Instructions (Signed)
Your physician wants you to follow-up in: 6 months. You will receive a reminder letter in the mail one-two months in advance. If you don't receive a letter, please call our office to schedule the follow-up appointment. Your physician recommends that you continue on your current medications as directed. Please refer to the Current Medication list given to you today. 

## 2011-08-01 NOTE — Assessment & Plan Note (Signed)
Atrial fibrillation rate is controlled. No change in therapy. 

## 2011-08-01 NOTE — Progress Notes (Addendum)
HPI Patient returns for followup of atrial fibrillation.  I saw her last March, 2012.  Fortunately she continues to be stable.  The patient has been hesitant about Coumadin.  She has had falls.  She has not had one recently but it is very reasonable for her to not be on Coumadin.  She's not having any chest pain or shortness of breath.  Allergies  Allergen Reactions  . Codeine     REACTION: nausea  . Lisinopril     REACTION: cough  . Prednisone   . Propoxyphene N-Acetaminophen   . Statins     Current Outpatient Prescriptions  Medication Sig Dispense Refill  . aspirin 81 MG tablet Take 81 mg by mouth daily.        Marland Kitchen b complex vitamins tablet Take 1 tablet by mouth daily.        . cinacalcet (SENSIPAR) 30 MG tablet Take 30 mg by mouth 2 (two) times daily.        Marland Kitchen DHA-Vitamin C-Lutein (EYE HEALTH FORMULA PO) Take by mouth.        . diltiazem (DILACOR XR) 180 MG 24 hr capsule Take 180 mg by mouth 2 (two) times daily.        . diphenhydramine-acetaminophen (TYLENOL PM) 25-500 MG TABS Take 1 tablet by mouth at bedtime as needed.        . doxazosin (CARDURA) 2 MG tablet Take 2 mg by mouth at bedtime.        . fesoterodine (TOVIAZ) 4 MG TB24 Take 4 mg by mouth daily.        . fexofenadine (ALLEGRA) 180 MG tablet Take 180 mg by mouth daily.        . fluticasone (FLONASE) 50 MCG/ACT nasal spray Place 2 sprays into the nose daily.        Marland Kitchen guaiFENesin (MUCINEX) 600 MG 12 hr tablet Take 1,200 mg by mouth as needed.        Marland Kitchen levothyroxine (SYNTHROID, LEVOTHROID) 88 MCG tablet Take 88 mcg by mouth daily.        . metoprolol (LOPRESSOR) 100 MG tablet Take 100 mg by mouth 2 (two) times daily.        . nitroGLYCERIN (NITROLINGUAL) 0.4 MG/SPRAY spray Place 1 spray under the tongue every 5 (five) minutes as needed.        . potassium chloride (KLOR-CON) 10 MEQ CR tablet Take 10 mEq by mouth daily.          History   Social History  . Marital Status: Married    Spouse Name: N/A    Number of  Children: N/A  . Years of Education: N/A   Occupational History  . Not on file.   Social History Main Topics  . Smoking status: Never Smoker   . Smokeless tobacco: Never Used  . Alcohol Use: No  . Drug Use: No  . Sexually Active: Not on file   Other Topics Concern  . Not on file   Social History Narrative  . No narrative on file    Family History  Problem Relation Age of Onset  . Heart failure Brother   . Heart failure Mother     Past Medical History  Diagnosis Date  . CAD (coronary artery disease)     PCI for MI, 2007  . Ejection fraction     EF 60-65%, echo, 08/2008  . PAD (peripheral artery disease)     of lower extremity  . Renal mass  right, biopsied in the past  . Dyslipidemia   . Hypothyroidism   . Anemia     normocytic  . Atrial fibrillation     Dr Ladona Ridgel, rate control, beta blockers, digoxin added 02/2010  . Carpal tunnel syndrome   . History of vertigo   . DDD (degenerative disc disease), cervical   . Syncopal episodes   . Aortic valve sclerosis     aortic valve thickening without stenosis  . CVA (cerebral vascular accident)     in hospital 11/2009  . Edema   . Other accident     mechanical fall with ruptured globe  . Pacemaker     Medtronic, brady/tachy-falls, 04/2010  . Carotid artery disease     doppler 09/2009, less than 50% bilaterally  . Hypertension   . Warfarin anticoagulation     stopped after fall 03/2010, patient declined to restart  . Vertigo   . Sick sinus syndrome     multiple falls, PTVDP 04/2010  . ACE-inhibitor cough     02/2010  . MR (mitral regurgitation)     mild/ mod  . Abnormal x-ray     chest 04/2010...needs f/u  . CKD (chronic kidney disease)     Past Surgical History  Procedure Date  . Pacemaker insertion     Medtronic    ROS  Patient denies fever, chills, headache, sweats, rash, change in vision, change in hearing, chest pain, cough, nausea vomiting, urinary symptoms.  All of the systems are reviewed and  are negative.  PHYSICAL EXAM Patient is here with her medical helper.  She is oriented to person time and place.  Affect is normal.  Head is atraumatic.  There is no jugular venous distention.  Lungs are clear.  Respiratory effort is nonlabored.  Cardiac exam reveals S1-S2.  No clicks or significant murmurs.  The abdomen is soft.  There is no peripheral edema. Filed Vitals:   08/01/11 1126  BP: 119/86  Pulse: 85  Height: 5' 2.5" (1.588 m)  Weight: 137 lb (62.143 kg)    EKG was done today and reviewed by me.  The rhythm is paced. ASSESSMENT & PLAN

## 2011-08-01 NOTE — Assessment & Plan Note (Signed)
Patient remains off Coumadin.  She is hesitant to use it.  My plan is not to try to push her.

## 2011-08-12 ENCOUNTER — Encounter: Payer: Medicare Other | Admitting: Internal Medicine

## 2011-08-31 ENCOUNTER — Encounter: Payer: Self-pay | Admitting: Internal Medicine

## 2011-08-31 ENCOUNTER — Ambulatory Visit (INDEPENDENT_AMBULATORY_CARE_PROVIDER_SITE_OTHER): Payer: Medicare Other | Admitting: Internal Medicine

## 2011-08-31 DIAGNOSIS — I495 Sick sinus syndrome: Secondary | ICD-10-CM

## 2011-08-31 DIAGNOSIS — I4891 Unspecified atrial fibrillation: Secondary | ICD-10-CM

## 2011-08-31 LAB — PACEMAKER DEVICE OBSERVATION
BATTERY VOLTAGE: 2.78 V
BMOD-0003RV: 30
BMOD-0005RV: 95 {beats}/min
BRDY-0002RV: 60 {beats}/min
RV LEAD AMPLITUDE: 31.36 mv
RV LEAD IMPEDENCE PM: 558 Ohm

## 2011-08-31 NOTE — Patient Instructions (Signed)
Your physician wants you to follow-up in: 12 months with Dr Allred You will receive a reminder letter in the mail two months in advance. If you don't receive a letter, please call our office to schedule the follow-up appointment.   Remote monitoring is used to monitor your Pacemaker of ICD from home. This monitoring reduces the number of office visits required to check your device to one time per year. It allows us to keep an eye on the functioning of your device to ensure it is working properly. You are scheduled for a device check from home on  12/01/2011 You may send your transmission at any time that day. If you have a wireless device, the transmission will be sent automatically. After your physician reviews your transmission, you will receive a postcard with your next transmission date.   

## 2011-08-31 NOTE — Assessment & Plan Note (Signed)
Stable permanent afib Rate controlled She declines anticoagulation Continue ASA

## 2011-08-31 NOTE — Progress Notes (Signed)
The patient presents today for routine electrophysiology followup.  Since last being seen in our clinic, the patient reports doing very well.  Today, she denies symptoms of palpitations, chest pain, shortness of breath, orthopnea, PND, lower extremity edema, dizziness, presyncope, syncope, or neurologic sequela.  The patient feels that she is tolerating medications without difficulties and is otherwise without complaint today.   Past Medical History  Diagnosis Date  . CAD (coronary artery disease)     PCI for MI, 2007  . Ejection fraction     EF 60-65%, echo, 08/2008  . PAD (peripheral artery disease)     of lower extremity  . Renal mass     right, biopsied in the past  . Dyslipidemia   . Hypothyroidism   . Anemia     normocytic  . Atrial fibrillation     permanent  . Carpal tunnel syndrome   . History of vertigo   . DDD (degenerative disc disease), cervical   . Syncopal episodes   . Aortic valve sclerosis     aortic valve thickening without stenosis  . CVA (cerebral vascular accident)     in hospital 11/2009  . Edema   . Other accident     mechanical fall with ruptured globe  . Pacemaker     Medtronic, brady/tachy-falls, 04/2010  . Carotid artery disease     doppler 09/2009, less than 50% bilaterally  . Hypertension   . Warfarin anticoagulation     stopped after fall 03/2010, patient declined to restart  . Vertigo   . Sick sinus syndrome     multiple falls, PTVDP 04/2010  . ACE-inhibitor cough     02/2010  . MR (mitral regurgitation)     mild/ mod  . Abnormal x-ray     chest 04/2010 /   Chest x-ray August, 2012, no active disease  . CKD (chronic kidney disease)    Past Surgical History  Procedure Date  . Pacemaker insertion     Medtronic    Current Outpatient Prescriptions  Medication Sig Dispense Refill  . aspirin 81 MG tablet Take 81 mg by mouth daily.        Marland Kitchen b complex vitamins tablet Take 1 tablet by mouth daily.        . cinacalcet (SENSIPAR) 30 MG tablet  Take 30 mg by mouth 2 (two) times daily.        Marland Kitchen DHA-Vitamin C-Lutein (EYE HEALTH FORMULA PO) Take by mouth.        . diltiazem (DILACOR XR) 180 MG 24 hr capsule Take 180 mg by mouth 2 (two) times daily.        . diphenhydramine-acetaminophen (TYLENOL PM) 25-500 MG TABS Take 1 tablet by mouth at bedtime as needed.        . doxazosin (CARDURA) 2 MG tablet Take 2 mg by mouth at bedtime.        . fesoterodine (TOVIAZ) 4 MG TB24 Take 4 mg by mouth daily.        . fexofenadine (ALLEGRA) 180 MG tablet Take 180 mg by mouth daily.        . fluticasone (FLONASE) 50 MCG/ACT nasal spray Place 2 sprays into the nose daily.        Marland Kitchen guaiFENesin (MUCINEX) 600 MG 12 hr tablet Take 1,200 mg by mouth as needed.        Marland Kitchen levothyroxine (SYNTHROID, LEVOTHROID) 88 MCG tablet Take 88 mcg by mouth daily.        Marland Kitchen  metoprolol (LOPRESSOR) 100 MG tablet Take 100 mg by mouth 2 (two) times daily.        . nitroGLYCERIN (NITROLINGUAL) 0.4 MG/SPRAY spray Place 1 spray under the tongue every 5 (five) minutes as needed.        . potassium chloride (KLOR-CON) 10 MEQ CR tablet Take 10 mEq by mouth daily.          Allergies  Allergen Reactions  . Codeine     REACTION: nausea  . Lisinopril     REACTION: cough  . Prednisone   . Propoxyphene N-Acetaminophen   . Statins     History   Social History  . Marital Status: Married    Spouse Name: N/A    Number of Children: N/A  . Years of Education: N/A   Occupational History  . Not on file.   Social History Main Topics  . Smoking status: Never Smoker   . Smokeless tobacco: Never Used  . Alcohol Use: No  . Drug Use: No  . Sexually Active: Not on file   Other Topics Concern  . Not on file   Social History Narrative  . No narrative on file    Family History  Problem Relation Age of Onset  . Heart failure Brother   . Heart failure Mother     Physical Exam: Filed Vitals:   08/31/11 1555  BP: 110/74  Pulse: 84  Height: 5' 2.5" (1.588 m)  Weight: 140 lb  (63.504 kg)    GEN- The patient is elderly appearing, alert and oriented x 3 today.   Head- normocephalic, atraumatic Eyes-  Sclera clear, conjunctiva pink Ears- hearing intact Oropharynx- clear Neck- supple, no JVP Lymph- no cervical lymphadenopathy Lungs- Clear to ausculation bilaterally, normal work of breathing Chest- pacemaker pocket is well healed Heart- Regular rate and rhythm, no murmurs, rubs or gallops, PMI not laterally displaced GI- soft, NT, ND, + BS Extremities- no clubbing, cyanosis, or edema  Pacemaker interrogation- reviewed in detail today,  See PACEART report  Assessment and Plan:

## 2011-08-31 NOTE — Assessment & Plan Note (Signed)
Normal pacemaker function See Pace Art report No changes today  

## 2011-09-25 IMAGING — CR DG CHEST 2V
1 series · 1 of 1 positions shown · non-contrast
Comparison: None.

CLINICAL DATA: History of follow-up of right pleural effusion.
History of right lower lobe pneumonia.  History of shortness of
breath.

CHEST - 2 VIEW

[w chest lat]
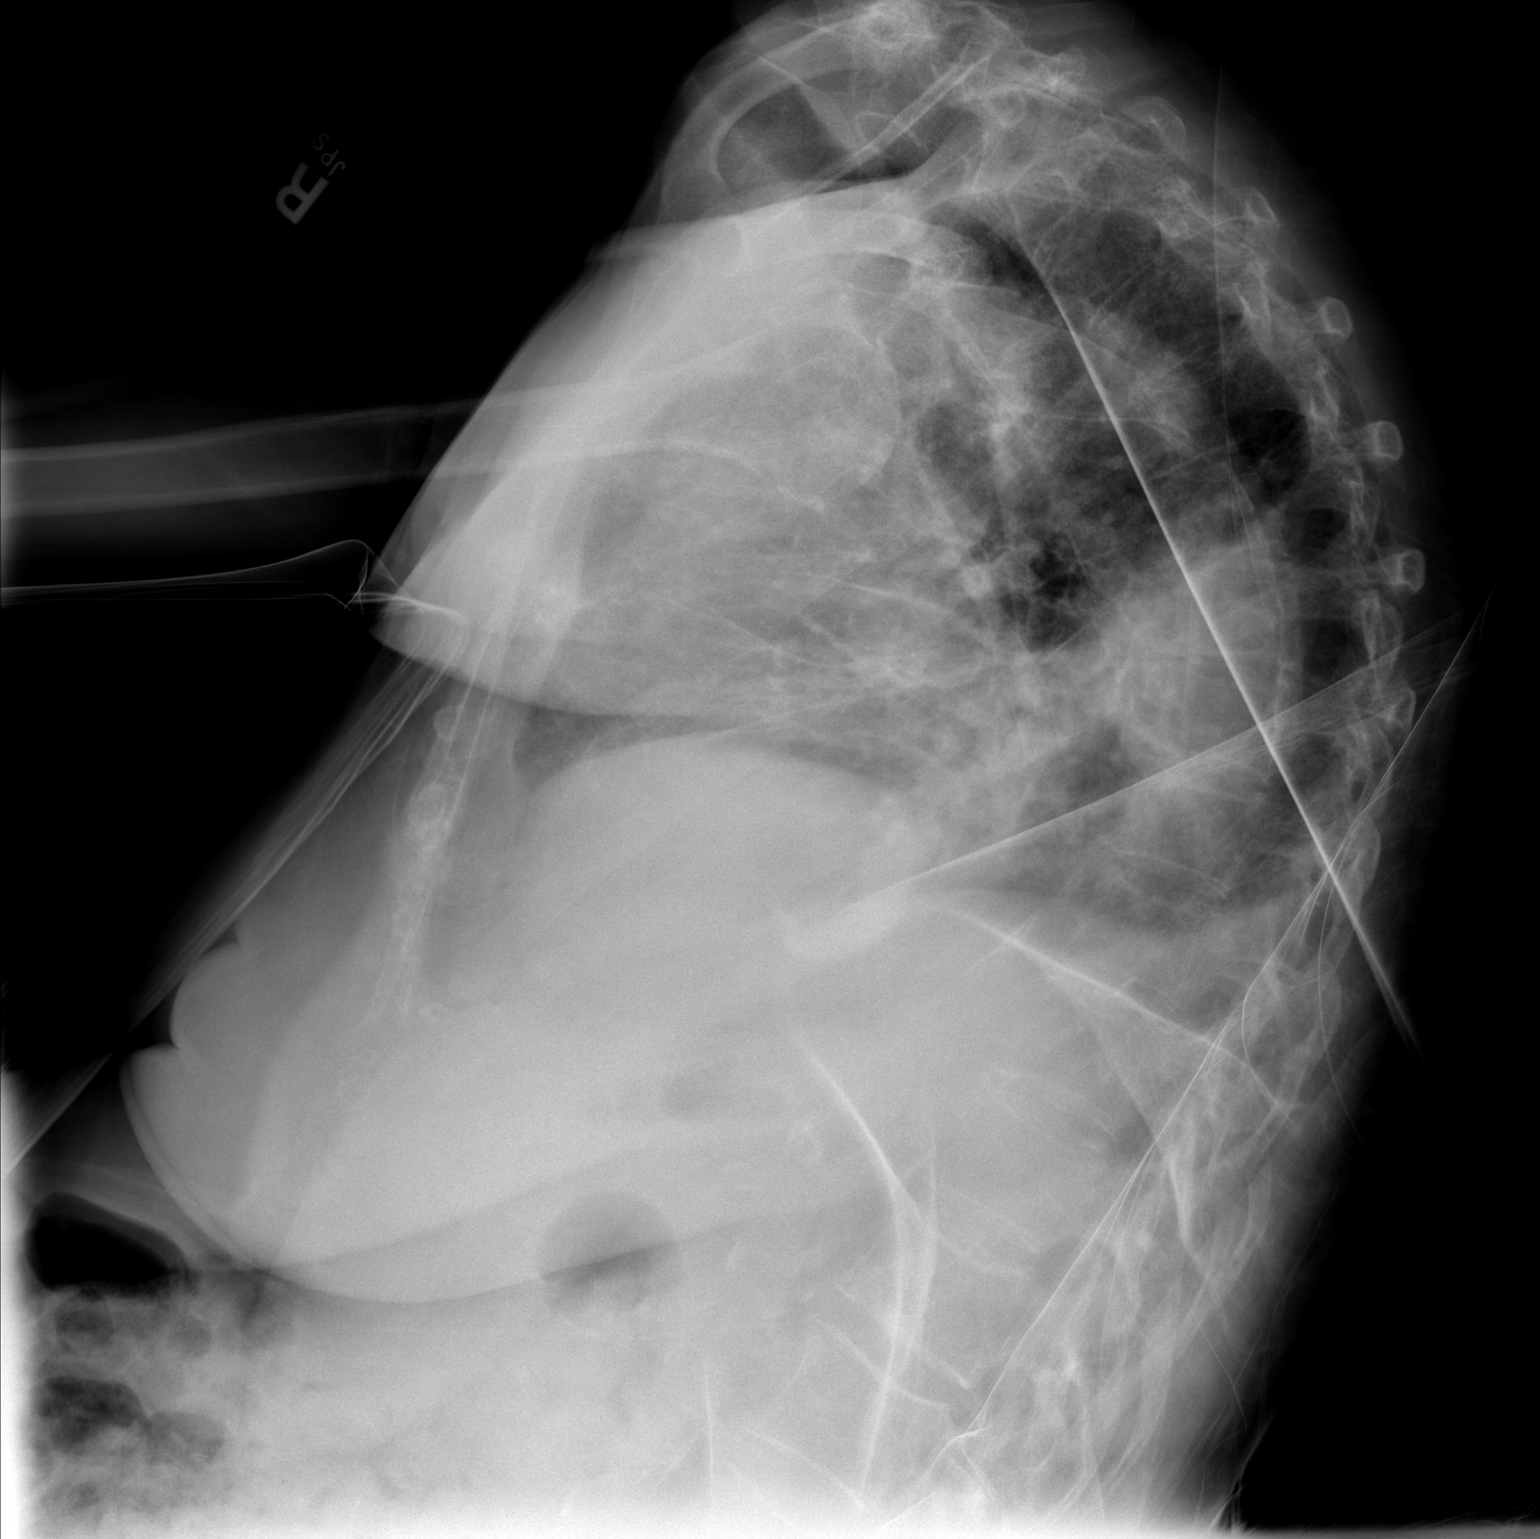

[1 of 1 positions shown; findings below may reference images not displayed]

FINDINGS: This The cardiac silhouette is moderately enlarged.
Ectasia and nonaneurysmal calcification of the thoracic aorta is
seen. Atelectasis and patchy infiltrate densities are seen in the
right mid and lower lung zones.  There is blunting of the right
costophrenic angle posteriorly consistent with small amount of
pleural effusion.  There is minimal left basilar atelectasis
medially. There is a mildly osteopenic appearance of the bones.
IMPRESSION: The cardiac silhouette is moderately enlarged.
Atelectasis and patchy infiltrate densities are seen in the right
mid and lower lung zones.  There is blunting of the right
costophrenic angle posteriorly consistent with small amount of
pleural effusion.  There is minimal left basilar atelectasis
medially.

## 2011-12-01 ENCOUNTER — Encounter: Payer: Medicare Other | Admitting: *Deleted

## 2011-12-05 ENCOUNTER — Encounter: Payer: Self-pay | Admitting: *Deleted

## 2012-01-24 ENCOUNTER — Telehealth: Payer: Self-pay | Admitting: Internal Medicine

## 2012-01-24 NOTE — Telephone Encounter (Signed)
Instructions given to [facility] Purdy(daughter) for [employer] transmissions.  She will send on Friday.

## 2012-01-24 NOTE — Telephone Encounter (Signed)
They are trying to get a transmission sent and having trouble she has called number on box

## 2012-02-19 IMAGING — CR DG CHEST 2V
1 series · 1 of 1 positions shown · non-contrast
Comparison: 12/15/2009

CLINICAL DATA: Post pacemaker placement for arrhythmia

CHEST - 2 VIEW

[view not recorded]
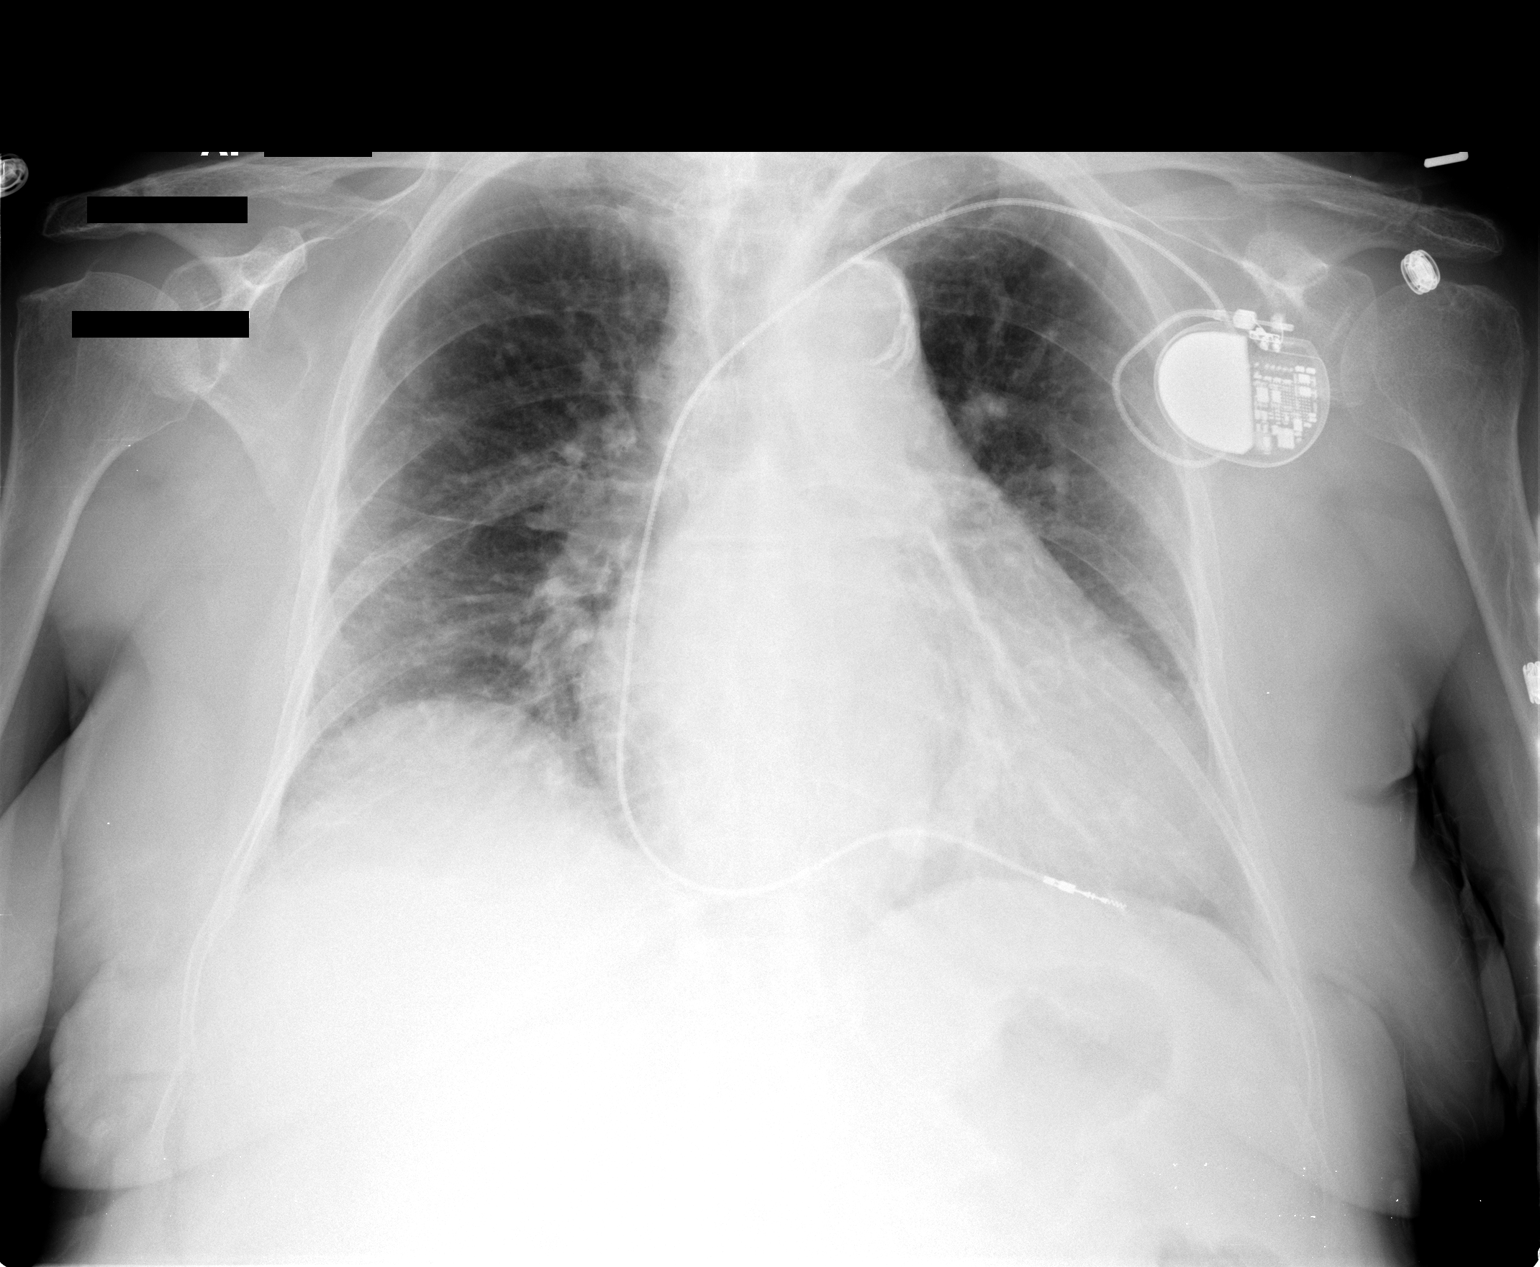

[1 of 1 positions shown; findings below may reference images not displayed]

FINDINGS: A single lead permanent cardiac pacer has been placed
from the left subclavian venous approach.  The lead is in the right
ventricle. No pneumothorax.

Heart enlarged with perhaps mild venous hypertension.  No frank
congestive heart failure or definite active disease in one-view.
However, there is a possible small nodule projecting over the right
upper lobe, just medial to the scapula.  This may be an artifact
but requires followup.  I would recommend a PA and lateral chest x-
ray when possible.
IMPRESSION: 1.  Left subclavian pacemaker placement - no pneumothorax.
2.  Mild venous hypertension - no frank congestive heart failure.
3.  Cannot rule out small right upper lobe nodule - see report.

## 2012-02-21 ENCOUNTER — Telehealth: Payer: Self-pay | Admitting: *Deleted

## 2012-02-21 NOTE — Telephone Encounter (Signed)
New msg Pt's daughter wants to know if you have been receiving transmissions

## 2012-02-22 NOTE — Telephone Encounter (Signed)
LMOVM no transmissions received. Pt to call back or call [employer] tech services to troubleshoot.

## 2012-07-23 ENCOUNTER — Other Ambulatory Visit: Payer: Self-pay | Admitting: Cardiology

## 2012-07-23 NOTE — Telephone Encounter (Signed)
Called and verified mother in law appointment for tomorrow, said he wasn't sure if she will be able to make it due to transportation issues.

## 2012-07-24 ENCOUNTER — Ambulatory Visit: Payer: Medicare Other | Admitting: Cardiology

## 2012-07-31 ENCOUNTER — Ambulatory Visit (INDEPENDENT_AMBULATORY_CARE_PROVIDER_SITE_OTHER): Payer: Medicare Other | Admitting: Cardiology

## 2012-07-31 ENCOUNTER — Encounter: Payer: Self-pay | Admitting: Cardiology

## 2012-07-31 VITALS — BP 136/84 | HR 90 | Ht 62.5 in | Wt 128.0 lb

## 2012-07-31 DIAGNOSIS — R609 Edema, unspecified: Secondary | ICD-10-CM

## 2012-07-31 DIAGNOSIS — I4891 Unspecified atrial fibrillation: Secondary | ICD-10-CM

## 2012-07-31 DIAGNOSIS — I2510 Atherosclerotic heart disease of native coronary artery without angina pectoris: Secondary | ICD-10-CM

## 2012-07-31 DIAGNOSIS — Z950 Presence of cardiac pacemaker: Secondary | ICD-10-CM

## 2012-07-31 DIAGNOSIS — I739 Peripheral vascular disease, unspecified: Secondary | ICD-10-CM

## 2012-07-31 DIAGNOSIS — I358 Other nonrheumatic aortic valve disorders: Secondary | ICD-10-CM

## 2012-07-31 DIAGNOSIS — I359 Nonrheumatic aortic valve disorder, unspecified: Secondary | ICD-10-CM

## 2012-07-31 NOTE — Assessment & Plan Note (Signed)
There is no edema at this time. No further workup.

## 2012-07-31 NOTE — Progress Notes (Signed)
Patient ID: [name], female   DOB: 03-15-1929, 78 y.o.   MRN: [id number]   HPI  Patient is seen today for one-year cardiology followup. She is elderly. She looks great. She is here with her daughter. She's not having any chest pain or shortness of breath. She goes about reasonable activities. She is not bothered by her atrial fibrillation. The patient has refused to restart Coumadin. She is stable.  Allergies  Allergen Reactions  . Codeine     REACTION: nausea  . Lisinopril     REACTION: cough  . Prednisone   . Propoxyphene-Acetaminophen   . Statins     Current Outpatient Prescriptions  Medication Sig Dispense Refill  . albuterol (PROAIR HFA) 108 (90 BASE) MCG/ACT inhaler Inhale 2 puffs into the lungs every 4 (four) hours as needed.      Marland Kitchen aspirin 81 MG tablet Take 81 mg by mouth daily.        Marland Kitchen b complex vitamins tablet Take 1 tablet by mouth daily.        . benzonatate (TESSALON) 100 MG capsule Take 100-200 mg by mouth 3 (three) times daily as needed.      . cinacalcet (SENSIPAR) 30 MG tablet Take 30 mg by mouth daily.       Marland Kitchen DHA-Vitamin C-Lutein (EYE HEALTH FORMULA PO) Take 1 capsule by mouth daily.       Marland Kitchen diltiazem (DILACOR XR) 180 MG 24 hr capsule Take 180 mg by mouth 2 (two) times daily.        . diphenhydrAMINE (BENADRYL) 25 MG tablet Take 25 mg by mouth at bedtime as needed.      . fesoterodine (TOVIAZ) 4 MG TB24 Take 2 mg by mouth daily.       . fexofenadine (ALLEGRA) 180 MG tablet Take 180 mg by mouth daily.        . fluticasone (FLONASE) 50 MCG/ACT nasal spray Place 2 sprays into the nose daily.        . furosemide (LASIX) 20 MG tablet Take 40 mg by mouth daily.      Marland Kitchen guaiFENesin (MUCINEX) 600 MG 12 hr tablet Take 600 mg by mouth as needed.       Marland Kitchen levothyroxine (SYNTHROID, LEVOTHROID) 88 MCG tablet Take 88 mcg by mouth daily.        . Melatonin 5 MG TABS Take 1 tablet by mouth at bedtime.      . metoprolol (LOPRESSOR) 100 MG tablet Take 100 mg by mouth 2 (two)  times daily.        . nitroGLYCERIN (NITROLINGUAL) 0.4 MG/SPRAY spray Place 1 spray under the tongue every 5 (five) minutes as needed.        . potassium chloride (KLOR-CON) 10 MEQ CR tablet Take 10 mEq by mouth daily.          History   Social History  . Marital Status: Married    Spouse Name: N/A    Number of Children: N/A  . Years of Education: N/A   Occupational History  . Not on file.   Social History Main Topics  . Smoking status: Never Smoker   . Smokeless tobacco: Never Used  . Alcohol Use: No  . Drug Use: No  . Sexually Active: Not on file   Other Topics Concern  . Not on file   Social History Narrative  . No narrative on file    Family History  Problem Relation Age of Onset  . Heart failure  Brother   . Heart failure Mother     Past Medical History  Diagnosis Date  . CAD (coronary artery disease)     PCI for MI, 2007  . Ejection fraction     EF 60-65%, echo, 08/2008  . PAD (peripheral artery disease)     of lower extremity  . Renal mass     right, biopsied in the past  . Dyslipidemia   . Hypothyroidism   . Anemia     normocytic  . Atrial fibrillation     permanent  . Carpal tunnel syndrome   . History of vertigo   . DDD (degenerative disc disease), cervical   . Syncopal episodes   . Aortic valve sclerosis     aortic valve thickening without stenosis  . CVA (cerebral vascular accident)     in hospital 11/2009  . Edema   . Other accident     mechanical fall with ruptured globe  . Pacemaker     Medtronic, brady/tachy-falls, 04/2010  . Carotid artery disease     doppler 09/2009, less than 50% bilaterally  . Hypertension   . Warfarin anticoagulation     stopped after fall 03/2010, patient declined to restart  . Vertigo   . Sick sinus syndrome     multiple falls, PTVDP 04/2010  . ACE-inhibitor cough     02/2010  . MR (mitral regurgitation)     mild/ mod  . Abnormal x-ray     chest 04/2010 /   Chest x-ray August, 2012, no active disease  .  CKD (chronic kidney disease)     Past Surgical History  Procedure Date  . Pacemaker insertion     Medtronic    Patient Active Problem List  Diagnosis  . CAD (coronary artery disease)  . Ejection fraction  . PAD (peripheral artery disease)  . Renal mass  . Dyslipidemia  . Hypothyroidism  . Anemia  . Atrial fibrillation  . Carpal tunnel syndrome  . History of vertigo  . Syncopal episodes  . Aortic valve sclerosis  . Edema  . Carotid artery disease  . Hypertension  . Warfarin anticoagulation  . Sick sinus syndrome  . ACE-inhibitor cough  . MR (mitral regurgitation)  . CKD (chronic kidney disease)  . Abnormal x-ray    ROS   Patient denies fever, chills, headache, sweats, rash, change in vision, change in hearing, chest pain, cough, nausea vomiting, urinary symptoms. She did have diarrhea recently but it has finally been cured by her primary physician.  PHYSICAL EXAM  Patient is oriented to person time and place. Affect is normal. She's here with her daughter. There is no jugulovenous distention. Lungs are clear. Respiratory effort is nonlabored. Cardiac exam her vitals S1 and S2. There are no clicks or significant murmurs. The abdomen is soft. There is no peripheral edema.  Filed Vitals:   07/31/12 1311  BP: 136/84  Pulse: 90  Height: 5' 2.5" (1.588 m)  Weight: 128 lb (58.06 kg)  SpO2: 98%    EKG  ASSESSMENT & PLAN

## 2012-07-31 NOTE — Assessment & Plan Note (Signed)
Patient's rate is controlled. She refuses Coumadin. No further workup.

## 2012-07-31 NOTE — Assessment & Plan Note (Signed)
Coronary disease is stable.  No further workup is needed. 

## 2012-07-31 NOTE — Assessment & Plan Note (Signed)
By history there is some aortic sclerosis. There is no stenosis. She does not need an echo at this time.

## 2012-07-31 NOTE — Assessment & Plan Note (Signed)
She has peripheral arterial disease but is not having any leg symptoms. No further workup.

## 2012-07-31 NOTE — Patient Instructions (Addendum)

## 2012-07-31 NOTE — Assessment & Plan Note (Signed)
The patient's pacemaker is followed by Dr. Johney Frame in [place]. We will be sure that her next checkup is scheduled. No further workup.

## 2012-08-06 ENCOUNTER — Ambulatory Visit: Payer: Medicare Other | Admitting: Cardiology

## 2012-08-26 ENCOUNTER — Encounter: Payer: Self-pay | Admitting: *Deleted

## 2012-08-31 ENCOUNTER — Encounter: Payer: Medicare Other | Admitting: Internal Medicine

## 2012-09-05 ENCOUNTER — Encounter: Payer: Medicare Other | Admitting: Internal Medicine

## 2012-11-14 ENCOUNTER — Encounter: Payer: Medicare Other | Admitting: Internal Medicine

## 2012-11-26 ENCOUNTER — Ambulatory Visit (INDEPENDENT_AMBULATORY_CARE_PROVIDER_SITE_OTHER): Payer: Medicare Other | Admitting: Internal Medicine

## 2012-11-26 ENCOUNTER — Encounter: Payer: Self-pay | Admitting: Internal Medicine

## 2012-11-26 VITALS — BP 118/72 | HR 66 | Ht 62.0 in | Wt 128.0 lb

## 2012-11-26 DIAGNOSIS — I495 Sick sinus syndrome: Secondary | ICD-10-CM

## 2012-11-26 DIAGNOSIS — I4891 Unspecified atrial fibrillation: Secondary | ICD-10-CM

## 2012-11-26 LAB — PACEMAKER DEVICE OBSERVATION
BATTERY VOLTAGE: 2.78 V
BMOD-0003RV: 30
BMOD-0005RV: 95 {beats}/min
RV LEAD AMPLITUDE: 31.36 mv
RV LEAD IMPEDENCE PM: 547 Ohm
RV LEAD THRESHOLD: 0.75 V
VENTRICULAR PACING PM: 18

## 2012-11-26 NOTE — Patient Instructions (Signed)
Continue all current medications. Allred - 1 year  

## 2012-11-26 NOTE — Progress Notes (Signed)
PCP: Ignatius Specking., MD Primary Cardiologist:  Dr Myrtis Ser  The patient presents today for routine electrophysiology followup.  Since last being seen in our clinic, the patient reports doing very well.  Today, she denies symptoms of palpitations, chest pain, shortness of breath, orthopnea, PND, lower extremity edema, dizziness, presyncope, syncope, or neurologic sequela.  The patient feels that she is tolerating medications without difficulties and is otherwise without complaint today.   Past Medical History  Diagnosis Date  . CAD (coronary artery disease)     PCI for MI, 2007  . Ejection fraction     EF 60-65%, echo, 08/2008  . PAD (peripheral artery disease)     of lower extremity  . Renal mass     right, biopsied in the past  . Dyslipidemia   . Hypothyroidism   . Anemia     normocytic  . Atrial fibrillation     permanent  . Carpal tunnel syndrome   . History of vertigo   . DDD (degenerative disc disease), cervical   . Syncopal episodes   . Aortic valve sclerosis     aortic valve thickening without stenosis  . CVA (cerebral vascular accident)     in hospital 11/2009  . Edema   . Other accident     mechanical fall with ruptured globe  . Pacemaker     Medtronic, brady/tachy-falls, 04/2010  . Carotid artery disease     doppler 09/2009, less than 50% bilaterally  . Hypertension   . Warfarin anticoagulation     stopped after fall 03/2010, patient declined to restart  . Vertigo   . Sick sinus syndrome     multiple falls, PTVDP 04/2010  . ACE-inhibitor cough     02/2010  . MR (mitral regurgitation)     mild/ mod  . Abnormal x-ray     chest 04/2010 /   Chest x-ray August, 2012, no active disease  . CKD (chronic kidney disease)   . Pacemaker     July, 2011 for multiple falls with sick sinus syndrome   Past Surgical History  Procedure Laterality Date  . Pacemaker insertion      Medtronic    Current Outpatient Prescriptions  Medication Sig Dispense Refill  . albuterol (PROAIR  HFA) 108 (90 BASE) MCG/ACT inhaler Inhale 2 puffs into the lungs every 4 (four) hours as needed.      Marland Kitchen aspirin 81 MG tablet Take 81 mg by mouth daily.        Marland Kitchen b complex vitamins tablet Take 1 tablet by mouth daily.        . benzonatate (TESSALON) 100 MG capsule Take 100-200 mg by mouth 3 (three) times daily as needed.      . cinacalcet (SENSIPAR) 30 MG tablet Take 30 mg by mouth daily.       Marland Kitchen DHA-Vitamin C-Lutein (EYE HEALTH FORMULA PO) Take 1 capsule by mouth daily.       Marland Kitchen diltiazem (DILACOR XR) 180 MG 24 hr capsule Take 180 mg by mouth 2 (two) times daily.        . diphenhydrAMINE (BENADRYL) 25 MG tablet Take 25 mg by mouth at bedtime as needed.      . fesoterodine (TOVIAZ) 4 MG TB24 Take 2 mg by mouth daily.       . fexofenadine (ALLEGRA) 180 MG tablet Take 180 mg by mouth daily.        . fluticasone (FLONASE) 50 MCG/ACT nasal spray Place 2 sprays into the nose daily.        Marland Kitchen  furosemide (LASIX) 20 MG tablet Take 40 mg by mouth daily.      Marland Kitchen guaiFENesin (MUCINEX) 600 MG 12 hr tablet Take 600 mg by mouth as needed.       Marland Kitchen levothyroxine (SYNTHROID, LEVOTHROID) 88 MCG tablet Take 88 mcg by mouth daily.        . Melatonin 5 MG TABS Take 1 tablet by mouth at bedtime.      . metoprolol (LOPRESSOR) 100 MG tablet Take 100 mg by mouth 2 (two) times daily.        . nitroGLYCERIN (NITROLINGUAL) 0.4 MG/SPRAY spray Place 1 spray under the tongue every 5 (five) minutes as needed.        . potassium chloride (KLOR-CON) 10 MEQ CR tablet Take 10 mEq by mouth daily.         No current facility-administered medications for this visit.    Allergies  Allergen Reactions  . Codeine     REACTION: nausea  . Lisinopril     REACTION: cough  . Prednisone   . Propoxyphene-Acetaminophen   . Statins     History   Social History  . Marital Status: Married    Spouse Name: N/A    Number of Children: N/A  . Years of Education: N/A   Occupational History  . Not on file.   Social History Main Topics   . Smoking status: Never Smoker   . Smokeless tobacco: Never Used  . Alcohol Use: No  . Drug Use: No  . Sexually Active: Not on file   Other Topics Concern  . Not on file   Social History Narrative  . No narrative on file    Family History  Problem Relation Age of Onset  . Heart failure Brother   . Heart failure Mother     Physical Exam: Filed Vitals:   11/26/12 1422  BP: 118/72  Pulse: 66  Height: 5\' 2"  (1.575 m)  Weight: 128 lb (58.06 kg)    GEN- The patient is elderly appearing, alert and oriented x 3 today.   Head- normocephalic, atraumatic Eyes-  Sclera clear, conjunctiva pink Ears- hearing intact Oropharynx- clear Neck- supple, no JVP Lymph- no cervical lymphadenopathy Lungs- Clear to ausculation bilaterally, normal work of breathing Chest- pacemaker pocket is well healed Heart- irregular rate and rhythm, no murmurs, rubs or gallops, PMI not laterally displaced GI- soft, NT, ND, + BS Extremities- no clubbing, cyanosis, or edema  Pacemaker interrogation- reviewed in detail today,  See PACEART report  Assessment and Plan:  1. afib Declines anticoagulation Continue current rate control  2. Tachy/brady Normal pacemaker function See Arita Miss Art report No changes today  Return to the device clinic in 1year

## 2012-12-25 DIAGNOSIS — I4891 Unspecified atrial fibrillation: Secondary | ICD-10-CM

## 2012-12-25 DIAGNOSIS — I5033 Acute on chronic diastolic (congestive) heart failure: Secondary | ICD-10-CM

## 2012-12-28 ENCOUNTER — Encounter: Payer: Self-pay | Admitting: Internal Medicine

## 2013-01-11 ENCOUNTER — Telehealth: Payer: Self-pay | Admitting: Physician Assistant

## 2013-01-11 ENCOUNTER — Encounter: Payer: Self-pay | Admitting: Physician Assistant

## 2013-01-11 ENCOUNTER — Ambulatory Visit (INDEPENDENT_AMBULATORY_CARE_PROVIDER_SITE_OTHER): Payer: Medicare Other | Admitting: Physician Assistant

## 2013-01-11 VITALS — BP 119/78 | HR 82 | Ht 62.0 in | Wt 135.0 lb

## 2013-01-11 DIAGNOSIS — Z950 Presence of cardiac pacemaker: Secondary | ICD-10-CM

## 2013-01-11 DIAGNOSIS — I340 Nonrheumatic mitral (valve) insufficiency: Secondary | ICD-10-CM

## 2013-01-11 DIAGNOSIS — I272 Pulmonary hypertension, unspecified: Secondary | ICD-10-CM | POA: Insufficient documentation

## 2013-01-11 DIAGNOSIS — E785 Hyperlipidemia, unspecified: Secondary | ICD-10-CM | POA: Insufficient documentation

## 2013-01-11 DIAGNOSIS — I5021 Acute systolic (congestive) heart failure: Secondary | ICD-10-CM

## 2013-01-11 DIAGNOSIS — I4891 Unspecified atrial fibrillation: Secondary | ICD-10-CM

## 2013-01-11 DIAGNOSIS — I2789 Other specified pulmonary heart diseases: Secondary | ICD-10-CM

## 2013-01-11 DIAGNOSIS — I059 Rheumatic mitral valve disease, unspecified: Secondary | ICD-10-CM

## 2013-01-11 MED ORDER — CARVEDILOL 12.5 MG PO TABS: 12.5000 mg | ORAL_TABLET | Freq: Two times a day (BID) | ORAL | Status: DC

## 2013-01-11 NOTE — Assessment & Plan Note (Signed)
Currently adequately rate controlled on Lopressor and diltiazem. We'll reassess at next OV, following today's substitution of Lopressor with carvedilol, and discontinuation of diltiazem. Would not add digoxin for additional HR control, in light of underlying CKD. Continue low-dose ASA, given her history of refusing anticoagulation.

## 2013-01-11 NOTE — Assessment & Plan Note (Signed)
Assessed as moderate, by recent echocardiogram. 

## 2013-01-11 NOTE — Patient Instructions (Signed)
   Stop Diltiazem  Stop Lopressor   Begin Coreg 12.5mg  twice a day   Continue all other current medications. Compression stockings  BP check at home and call reading to office:  [phone]  (around April 11) Follow up in  1 month

## 2013-01-11 NOTE — Assessment & Plan Note (Signed)
Assessed as severe (RVSP 75 mmHg), by recent echocardiogram

## 2013-01-11 NOTE — Assessment & Plan Note (Signed)
Followed by Dr. Allred. 

## 2013-01-11 NOTE — Assessment & Plan Note (Signed)
Patient has documented intolerance to multiple statins. Recent lipid profile notable for LDL 98. Will defer to Dr. Myrtis Ser regarding utility of adding omega-3 fish oil, at time of next OV.

## 2013-01-11 NOTE — Progress Notes (Addendum)
Primary Cardiologist: Jerral Bonito, MD   HPI: Post hospital followup from [facility], status post presentation with acute heart failure. She ruled out for MI with normal cardiac markers, and was referred to [place] in consultation.  She was treated aggressively with IV Lasix and maintained stable renal function, with no exacerbation of CKD (peak creatinine 1.8 on admission,1.5 at discharge). She presented with permanent atrial fibrillation and continued to decline Coumadin anticoagulation. She was maintained on low-dose ASA, and home dose metoprolol and diltiazem for rate control.   - Complete echocardiogram, March 11: EF 35-40%, global HK; normal RVF; moderate MR/TR; severe PHTN (RVSP 75 mmHg)  She presents with her caregiver today, who notes fluctuation in weights from 132 to 140. Patient does not add salt in her diet. She had recent labs, per Dr. Sherril Croon, with subsequent instructions to hold Lasix for 2 days, and then to resume at the current reduced dose. She sleeps in a hospital bed, and reports no PND. Her peripheral edema remains stable, as compared to recent presentation.  Allergies  Allergen Reactions  . Codeine     REACTION: nausea  . Lisinopril     REACTION: cough  . Prednisone   . Propoxyphene-Acetaminophen   . Statins     Current Outpatient Prescriptions  Medication Sig Dispense Refill  . albuterol (PROAIR HFA) 108 (90 BASE) MCG/ACT inhaler Inhale 2 puffs into the lungs every 4 (four) hours as needed.      Marland Kitchen aspirin 81 MG tablet Take 81 mg by mouth daily.        Marland Kitchen b complex vitamins tablet Take 1 tablet by mouth daily.        . benzonatate (TESSALON) 100 MG capsule Take 100-200 mg by mouth 3 (three) times daily as needed.      . cinacalcet (SENSIPAR) 30 MG tablet Take 30 mg by mouth daily.       Marland Kitchen DHA-Vitamin C-Lutein (EYE HEALTH FORMULA PO) Take 1 capsule by mouth daily.       . diphenhydrAMINE (BENADRYL) 25 MG tablet Take 25 mg by mouth at bedtime as needed.      . Fe Fum-FA-B  Cmp-C-Zn-Mg-Mn-Cu (HEMATINIC PLUS COMPLEX PO) Take 1 tablet by mouth daily.      . fesoterodine (TOVIAZ) 4 MG TB24 Take 2 mg by mouth daily.       . fexofenadine (ALLEGRA) 180 MG tablet Take 180 mg by mouth daily.        . fluticasone (FLONASE) 50 MCG/ACT nasal spray Place 2 sprays into the nose daily.        . furosemide (LASIX) 20 MG tablet Take 40 mg by mouth daily.      Marland Kitchen guaiFENesin (MUCINEX) 600 MG 12 hr tablet Take 600 mg by mouth as needed.       Marland Kitchen levothyroxine (SYNTHROID, LEVOTHROID) 88 MCG tablet Take 88 mcg by mouth daily.        . nitroGLYCERIN (NITROLINGUAL) 0.4 MG/SPRAY spray Place 1 spray under the tongue every 5 (five) minutes as needed.        . potassium chloride (KLOR-CON) 10 MEQ CR tablet Take 10 mEq by mouth daily.        . carvedilol (COREG) 12.5 MG tablet Take 1 tablet (12.5 mg total) by mouth 2 (two) times daily.  60 tablet  6   No current facility-administered medications for this visit.    Past Medical History  Diagnosis Date  . CAD (coronary artery disease)     PCI for  MI, 2007  . Ejection fraction     EF 60-65%, echo, 08/2008  . PAD (peripheral artery disease)     of lower extremity  . Renal mass     right, biopsied in the past  . Dyslipidemia   . Hypothyroidism   . Anemia     normocytic  . Atrial fibrillation     permanent  . Carpal tunnel syndrome   . History of vertigo   . DDD (degenerative disc disease), cervical   . Syncopal episodes   . Aortic valve sclerosis     aortic valve thickening without stenosis  . CVA (cerebral vascular accident)     in hospital 11/2009  . Edema   . Other accident     mechanical fall with ruptured globe  . Pacemaker     Medtronic, brady/tachy-falls, 04/2010  . Carotid artery disease     doppler 09/2009, less than 50% bilaterally  . Hypertension   . Warfarin anticoagulation     stopped after fall 03/2010, patient declined to restart  . Vertigo   . Sick sinus syndrome     multiple falls, PTVDP 04/2010  .  ACE-inhibitor cough     02/2010  . MR (mitral regurgitation)     mild/ mod  . Abnormal x-ray     chest 04/2010 /   Chest x-ray August, 2012, no active disease  . CKD (chronic kidney disease)   . Pacemaker     July, 2011 for multiple falls with sick sinus syndrome  . Pulmonary hypertension   . HLD (hyperlipidemia)     Past Surgical History  Procedure Laterality Date  . Pacemaker insertion      Medtronic    History   Social History  . Marital Status: Married    Spouse Name: N/A    Number of Children: N/A  . Years of Education: N/A   Occupational History  . Not on file.   Social History Main Topics  . Smoking status: Never Smoker   . Smokeless tobacco: Never Used  . Alcohol Use: No  . Drug Use: No  . Sexually Active: Not on file   Other Topics Concern  . Not on file   Social History Narrative  . No narrative on file   Social History Narrative  . No narrative on file    Problem Relation Age of Onset  . Heart failure Brother   . Heart failure Mother     ROS: no nausea, vomiting; no fever, chills; no melena, hematochezia; no claudication  PHYSICAL EXAM: BP 119/78  Pulse 82  Ht 5\' 2"  (1.575 m)  Wt 135 lb (61.236 kg)  BMI 24.69 kg/m2 GENERAL: 79 year old female, sitting in wheelchair; NAD HEENT: NCAT, PERRLA, EOMI; sclera clear; no xanthelasma NECK: Positive JVD on the right, and 90 LUNGS: Diminished breath sounds, crackles or wheezes CARDIAC: Irregularly irregular (S1, S2); no significant murmurs; no rubs or gallops ABDOMEN: soft, non-tender; intact BS EXTREMETIES: 1+ bilateral peripheral edema SKIN: warm/dry; no obvious rash/lesions MUSCULOSKELETAL: no joint deformity NEURO: no focal deficit; NL affect   EKG:    ASSESSMENT & PLAN:  Acute systolic heart failure Patient recently presented with new onset systolic heart failure, with an EF 35-40% with global HK, by echocardiography. Previous echocardiogram, 11/2009, yielded normal LVF (EF 60-65%), with  no WMAs. Patient has known CAD with remote MI/PCI in 2007. Her last ischemic evaluation was a NL adenosine Cardiolite; EF 70%, 01/2008. Patient's recent presentation was absent of complaint of CP, and  cardiac markers were NL. Given the deterioration in LVF, however, she will require a repeat ischemic evaluation. At this time, however, I recommend continued aggressive diuretic management of persistent volume overload, with early followup with Dr. Myrtis Ser in one month. At that time, we may then decide whether to proceed with an aggressive evaluation (i.e. cardiac catheterization) versus a repeat nuclear imaging study to rule out high risk ischemia. In the meanwhile, we will request most recent blood work from Dr. Sherril Croon' office. I suspect that she will need an increased dose of Lasix, with close monitoring of renal function. We will also repeat a BNP level, and none was done recently. Regarding medications, I will substitute Lopressor with carvedilol 12.5 twice a day. Diltiazem will be discontinued, in light of LV dysfunction. We will arrange for an RN visit in 2 weeks to check HR/BP and, if stable, further uptitrate carvedilol, as tolerated, with a target of 25 mg twice a day. Will also order knee-high compression stockings, for additional management of peripheral edema and to improve venous return.  Atrial fibrillation Currently adequately rate controlled on Lopressor and diltiazem. We'll reassess at next OV, following today's substitution of Lopressor with carvedilol, and discontinuation of diltiazem. Would not add digoxin for additional HR control, in light of underlying CKD. Continue low-dose ASA, given her history of refusing anticoagulation.  Pacemaker-Medtronic Followed by Dr. Johney Frame  MR (mitral regurgitation) Assessed as moderate, by recent echocardiogram  Pulmonary hypertension Assessed as severe (RVSP 75 mmHg), by recent echocardiogram  HLD (hyperlipidemia) Patient has documented intolerance to  multiple statins. Recent lipid profile notable for LDL 98. Will defer to Dr. Myrtis Ser regarding utility of adding omega-3 fish oil, at time of next OV.  Gene Donyea Gafford, PAC

## 2013-01-11 NOTE — Assessment & Plan Note (Signed)
Patient recently presented with new onset systolic heart failure, with an EF 35-40% with global HK, by echocardiography. Previous echocardiogram, 11/2009, yielded normal LVF (EF 60-65%), with no WMAs. Patient has known CAD with remote MI/PCI in 2007. Her last ischemic evaluation was a NL adenosine Cardiolite; EF 70%, 01/2008. Patient's recent presentation was absent of complaint of CP, and cardiac markers were NL. Given the deterioration in LVF, however, she will require a repeat ischemic evaluation. At this time, however, I recommend continued aggressive diuretic management of persistent widening overload, with early followup with Dr. Myrtis Ser in one month. At that time, we may then decide whether to proceed with an aggressive evaluation (i.e. cardiac catheterization) versus a repeat nuclear imaging study to rule out high risk ischemia. In the meanwhile, we will request most recent blood work from Dr. Sherril Croon' office. I suspect that she will need an increased dose of Lasix, with close monitoring of renal function. We will also repeat a BNP level, and none was done recently. Regarding medications, I will substitute Lopressor with carvedilol 12.5 twice a day. Diltiazem will be discontinued, in light of LV dysfunction. We will arrange for an RN visit in 2 weeks to check HR/BP and, if stable, further uptitrate carvedilol, as tolerated, with a target of 25 mg twice a day. Will also order knee-high compression stockings, for additional management of peripheral edema and to improve venous return.

## 2013-01-14 ENCOUNTER — Telehealth: Payer: Self-pay | Admitting: *Deleted

## 2013-01-14 NOTE — Telephone Encounter (Signed)
Message left on voice mail from daughter Arlyss Gandy) -  Stated she could not be at visit due being in [place] with her son for wedding.  Had to send CNA with her mom & did not think that Gene knew info about her Lasix .  Lasix had adverse effect on her kidneys while she was in hospital , was off of Lasix x 2 days prior to visit with Gene on Friday, Dr. Sherril Croon was going to put her on reduced dose of 60mg  to 40mg  daily.  Gene took away one of her bp meds, going to keep her on same meds now that she is home unless gene has really good reason why she should not   3/31-4:25 - LM

## 2013-01-14 NOTE — Telephone Encounter (Signed)
Advised Gene Serpe, PA of below.    Daughter Arline Asp) returned call - informed her that she could leave things as is, but should try to be present at next OV on 4/28.  Stated that she felt like Gene panicked with the weight gain and changed all of those meds.  Daughter stated that she felt like he did not need to do that and she will keep meds the same as before.  Daughter on cell phone so reception fading in and out & informed her that I could not hear her.  She then stated that she was getting an incoming call and had to hang up.  Call ended.

## 2013-02-11 ENCOUNTER — Encounter: Payer: Self-pay | Admitting: Physician Assistant

## 2013-02-11 ENCOUNTER — Ambulatory Visit (INDEPENDENT_AMBULATORY_CARE_PROVIDER_SITE_OTHER): Payer: Medicare Other | Admitting: Physician Assistant

## 2013-02-11 ENCOUNTER — Encounter: Payer: Self-pay | Admitting: *Deleted

## 2013-02-11 VITALS — BP 127/85 | HR 50 | Ht 61.0 in | Wt 134.0 lb

## 2013-02-11 DIAGNOSIS — I2589 Other forms of chronic ischemic heart disease: Secondary | ICD-10-CM

## 2013-02-11 DIAGNOSIS — I4891 Unspecified atrial fibrillation: Secondary | ICD-10-CM

## 2013-02-11 DIAGNOSIS — I272 Pulmonary hypertension, unspecified: Secondary | ICD-10-CM

## 2013-02-11 DIAGNOSIS — I5022 Chronic systolic (congestive) heart failure: Secondary | ICD-10-CM

## 2013-02-11 DIAGNOSIS — R0602 Shortness of breath: Secondary | ICD-10-CM

## 2013-02-11 DIAGNOSIS — Z950 Presence of cardiac pacemaker: Secondary | ICD-10-CM

## 2013-02-11 DIAGNOSIS — I2789 Other specified pulmonary heart diseases: Secondary | ICD-10-CM

## 2013-02-11 DIAGNOSIS — I255 Ischemic cardiomyopathy: Secondary | ICD-10-CM | POA: Insufficient documentation

## 2013-02-11 NOTE — Patient Instructions (Addendum)
   Lexiscan cardiolite (stress test)  Stop Cartia Continue all other current medications. Lab for BNP Office will notify of results  Follow up in  1 month - JK

## 2013-02-11 NOTE — Assessment & Plan Note (Signed)
Following extensive discussion with the patient's daughter, plan is to proceed with a Lexiscan Cardiolite for risk stratification, and rule out of high risk anatomy. Patient is at high risk for a cardiac catheterization, given history of stroke and underlying CKD. We'll plan early clinic followup with Dr. Myrtis Ser in the next several weeks, for review of study results and further recommendations. As previously noted, patient ruled out for MI with NL troponins, during recent normalization. However, she presented with newly documented LVD (EF 35-40%), with global HK. Her previous ischemic evaluation was a NL adenosine Cardiolite; EF 70%, in April 2009. Regarding medications, will also discontinue Cardizem, and continue treatment with beta blocker.

## 2013-02-11 NOTE — Assessment & Plan Note (Addendum)
Rate controlled on current medication regimen. However, I am stopping Cardizem today, in light of her newly documented cardiomyopathy. If needed, we can increase her beta blocker for rate control, or add low-dose digoxin. Patient remains on low-dose ASA, having been previously deemed no longer a suitable candidate to continue on Coumadin. We also note that she had a recent fall.

## 2013-02-11 NOTE — Assessment & Plan Note (Signed)
Patient is now on continuous supplemental oxygen.

## 2013-02-11 NOTE — Assessment & Plan Note (Signed)
Followed by Dr. Allred. 

## 2013-02-11 NOTE — Progress Notes (Addendum)
Primary Cardiologist: Jerral Bonito, MD   HPI: Scheduled one-month followup.  When last seen on March 28, patient presented for post hospital followup from [facility], status post recent presentation with acute heart failure. Cardiac markers NL. She presented with known CAD and prior history of normal LVF. Her most recent ischemic evaluation was a NL adenosine Cardiolite in 2009. During this presentation, however, she presented with newly documented LVD (EF 35-40%), with global HK and moderate MR.   At time of last OV, I recommended continued aggressive diuretic management of persistent volume overload. This would then be followed by decision today regarding ischemic evaluation either by cardiac catheterization or repeat nuclear imaging study, the latter to rule out high risk ischemia.  Clinically, she denies any CP or symptoms suggestive of decompensated heart failure. She presents with her daughter today, who feels overall that the patient is doing quite well since her recent hospitalization. She sleeps in a hospital bed at home, and there is no suggestion of PND. There is no significant peripheral edema. Of note, patient reportedly fell last week, unwitnessed, but did not suffer any serious injury.  Patient continues to remain compliant with her medications, is weighed several times a week, and does not have added salt in her diet. She has a caregiver who also presents with her today.    12-lead EKG for review by me, indicates atrial fibrillation with occasional ventricular pacing at 80 bpm.  Allergies  Allergen Reactions  . Codeine     REACTION: nausea  . Lisinopril     REACTION: cough  . Prednisone   . Propoxyphene-Acetaminophen   . Statins     Current Outpatient Prescriptions  Medication Sig Dispense Refill  . albuterol (PROAIR HFA) 108 (90 BASE) MCG/ACT inhaler Inhale 2 puffs into the lungs every 4 (four) hours as needed.      Marland Kitchen aspirin 81 MG tablet Take 81 mg by mouth daily.        Marland Kitchen b  complex vitamins tablet Take 1 tablet by mouth daily.        . benzonatate (TESSALON) 100 MG capsule Take 100-200 mg by mouth 3 (three) times daily as needed.      . cinacalcet (SENSIPAR) 30 MG tablet Take 30 mg by mouth daily.       Marland Kitchen DHA-Vitamin C-Lutein (EYE HEALTH FORMULA PO) Take 1 capsule by mouth daily.       . diphenhydrAMINE (BENADRYL) 25 MG tablet Take 25 mg by mouth at bedtime as needed.      . Fe Fum-FA-B Cmp-C-Zn-Mg-Mn-Cu (HEMATINIC PLUS COMPLEX PO) Take 1 tablet by mouth daily.      . fesoterodine (TOVIAZ) 4 MG TB24 Take 2 mg by mouth daily.       . fexofenadine (ALLEGRA) 180 MG tablet Take 180 mg by mouth daily.        . fluticasone (FLONASE) 50 MCG/ACT nasal spray Place 2 sprays into the nose daily.        . furosemide (LASIX) 20 MG tablet Take 40 mg by mouth daily.      Marland Kitchen guaiFENesin (MUCINEX) 600 MG 12 hr tablet Take 600 mg by mouth as needed.       Marland Kitchen levothyroxine (SYNTHROID, LEVOTHROID) 88 MCG tablet Take 88 mcg by mouth daily.        . nitroGLYCERIN (NITROLINGUAL) 0.4 MG/SPRAY spray Place 1 spray under the tongue every 5 (five) minutes as needed.        . potassium chloride (KLOR-CON) 10 MEQ  CR tablet Take 10 mEq by mouth daily.        Marland Kitchen CARTIA XT 180 MG 24 hr capsule       . metoprolol succinate (TOPROL-XL) 100 MG 24 hr tablet        No current facility-administered medications for this visit.    Past Medical History  Diagnosis Date  . CAD (coronary artery disease)     PCI for MI, 2007  . Ejection fraction     EF 60-65%, echo, 08/2008  . PAD (peripheral artery disease)     of lower extremity  . Renal mass     right, biopsied in the past  . Dyslipidemia   . Hypothyroidism   . Anemia     normocytic  . Atrial fibrillation     permanent  . Carpal tunnel syndrome   . History of vertigo   . DDD (degenerative disc disease), cervical   . Syncopal episodes   . Aortic valve sclerosis     aortic valve thickening without stenosis  . CVA (cerebral vascular accident)      in hospital 11/2009  . Edema   . Other accident     mechanical fall with ruptured globe  . Pacemaker     Medtronic, brady/tachy-falls, 04/2010  . Carotid artery disease     doppler 09/2009, less than 50% bilaterally  . Hypertension   . Warfarin anticoagulation     stopped after fall 03/2010, patient declined to restart  . Vertigo   . Sick sinus syndrome     multiple falls, PTVDP 04/2010  . ACE-inhibitor cough     02/2010  . MR (mitral regurgitation)     mild/ mod  . Abnormal x-ray     chest 04/2010 /   Chest x-ray August, 2012, no active disease  . CKD (chronic kidney disease)   . Pacemaker     July, 2011 for multiple falls with sick sinus syndrome  . Pulmonary hypertension   . HLD (hyperlipidemia)     Past Surgical History  Procedure Laterality Date  . Pacemaker insertion      Medtronic    History   Social History  . Marital Status: Married    Spouse Name: N/A    Number of Children: N/A  . Years of Education: N/A   Occupational History  . Not on file.   Social History Main Topics  . Smoking status: Never Smoker   . Smokeless tobacco: Never Used  . Alcohol Use: No  . Drug Use: No  . Sexually Active: Not on file   Other Topics Concern  . Not on file   Social History Narrative  . No narrative on file    Family History  Problem Relation Age of Onset  . Heart failure Brother   . Heart failure Mother     ROS: no nausea, vomiting; no fever, chills; no melena, hematochezia; no claudication  PHYSICAL EXAM: BP 127/85  Pulse 50  Ht 5\' 1"  (1.549 m)  Wt 134 lb (60.782 kg)  BMI 25.33 kg/m2 GENERAL: 79 year old female, sitting in wheelchair; NAD  HEENT: NCAT, PERRLA, EOMI; sclera clear; no xanthelasma  NECK: Positive JVD on the right, and 90  LUNGS: Diminished breath sounds, no crackles or wheezes  CARDIAC: Irregularly irregular (S1, S2); no significant murmurs; no rubs or gallops  ABDOMEN: soft, non-tender; intact BS  EXTREMETIES: 1+ bilateral  peripheral edema  SKIN: warm/dry; no obvious rash/lesions  MUSCULOSKELETAL: no joint deformity  NEURO: no focal deficit; NL  affect    EKG: reviewed and available in Electronic Records   ASSESSMENT & PLAN:  Chronic systolic heart failure Patient appears stable by exam, and as corroborated by her daughter and caregiver. She has diuresed 1 pound since last of a. However, she does continue to have evidence of volume overload. She is on low-dose Lasix and is followed closely by Dr. Kristian Covey, for history of CKD. Metabolic profile from today indicates a creatinine of 1.3, down from 1.5 on March 12. We'll check a BNP level today, and reassess volume status at time of next OV.  Ischemic cardiomyopathy Following extensive discussion with the patient's daughter, plan is to proceed with a Lexiscan Cardiolite for risk stratification, and rule out of high risk anatomy. Patient is at high risk for a cardiac catheterization, given history of stroke and underlying CKD. We'll plan early clinic followup with Dr. Myrtis Ser in the next several weeks, for review of study results and further recommendations. As previously noted, patient ruled out for MI with NL troponins, during recent normalization. However, she presented with newly documented LVD (EF 35-40%), with global HK. Her previous ischemic evaluation was a NL adenosine Cardiolite; EF 70%, in April 2009. Regarding medications, will also discontinue Cardizem, and continue treatment with beta blocker.  Atrial fibrillation Rate controlled on current medication regimen. However, I am stopping Cardizem today, in light of her newly documented cardiomyopathy. If needed, we can increase her beta blocker for rate control, or add low-dose digoxin. Patient remains on low-dose ASA, having been previously deemed no longer a suitable candidate to continue on Coumadin. We also note that she had a recent fall.  Pacemaker-Medtronic Followed by Dr. Johney Frame  Pulmonary  hypertension Patient is now on continuous supplemental oxygen.    Gene Devita Nies, PAC

## 2013-02-11 NOTE — Assessment & Plan Note (Signed)
Patient appears stable by exam, and as corroborated by her daughter and caregiver. She has diuresed 1 pound since last of a. However, she does continue to have evidence of volume overload. She is on low-dose so Lasix, and is followed closely by Dr. Kristian Covey, for history of CKD. Metabolic profile from today indicates a creatinine of 1.3, down from 1.5 on March 12. We'll check a BNP level today, and reassess volume status at time of next OV.

## 2013-02-13 DIAGNOSIS — I4891 Unspecified atrial fibrillation: Secondary | ICD-10-CM

## 2013-02-17 DIAGNOSIS — I2589 Other forms of chronic ischemic heart disease: Secondary | ICD-10-CM

## 2013-02-17 DIAGNOSIS — I5043 Acute on chronic combined systolic (congestive) and diastolic (congestive) heart failure: Secondary | ICD-10-CM

## 2013-02-18 DIAGNOSIS — I509 Heart failure, unspecified: Secondary | ICD-10-CM

## 2013-02-20 ENCOUNTER — Encounter: Payer: Self-pay | Admitting: Cardiology

## 2013-02-20 DIAGNOSIS — I509 Heart failure, unspecified: Secondary | ICD-10-CM

## 2013-03-17 DIAGNOSIS — I509 Heart failure, unspecified: Secondary | ICD-10-CM

## 2013-03-18 DIAGNOSIS — I509 Heart failure, unspecified: Secondary | ICD-10-CM

## 2013-03-26 ENCOUNTER — Encounter: Payer: Self-pay | Admitting: Cardiology

## 2013-03-28 ENCOUNTER — Ambulatory Visit (INDEPENDENT_AMBULATORY_CARE_PROVIDER_SITE_OTHER): Payer: Medicare Other | Admitting: Cardiology

## 2013-03-28 ENCOUNTER — Encounter: Payer: Self-pay | Admitting: Cardiology

## 2013-03-28 VITALS — BP 121/78 | HR 74 | Ht 62.5 in | Wt 128.0 lb

## 2013-03-28 DIAGNOSIS — I059 Rheumatic mitral valve disease, unspecified: Secondary | ICD-10-CM

## 2013-03-28 DIAGNOSIS — I2510 Atherosclerotic heart disease of native coronary artery without angina pectoris: Secondary | ICD-10-CM

## 2013-03-28 DIAGNOSIS — Z7901 Long term (current) use of anticoagulants: Secondary | ICD-10-CM

## 2013-03-28 DIAGNOSIS — I4891 Unspecified atrial fibrillation: Secondary | ICD-10-CM

## 2013-03-28 DIAGNOSIS — Z950 Presence of cardiac pacemaker: Secondary | ICD-10-CM

## 2013-03-28 DIAGNOSIS — I5022 Chronic systolic (congestive) heart failure: Secondary | ICD-10-CM

## 2013-03-28 DIAGNOSIS — I340 Nonrheumatic mitral (valve) insufficiency: Secondary | ICD-10-CM

## 2013-03-28 NOTE — Assessment & Plan Note (Signed)
She does have a pacemaker. This has been stable.

## 2013-03-28 NOTE — Assessment & Plan Note (Signed)
Patient is not on Coumadin. It was stopped after a fall in June, 2011. Over time the patient repeatedly decided she did not want to resume it.

## 2013-03-28 NOTE — Patient Instructions (Addendum)
Your physician recommends that you schedule a follow-up appointment in: 3 months. Your physician recommends that you continue on your current medications as directed. Please refer to the Current Medication list given to you today. Your physician recommends that you have lab work done by your home health nurse for BMET.

## 2013-03-28 NOTE — Progress Notes (Signed)
HPI  The patient is seen back to followup her CHF. She was seen last in the office in February 11, 2013. At that time she was post hospital visit in March, 2014. She was there for CHF. She had new worsening left ventricular dysfunction. After her last office visit she had a nuclear scan. I have carefully reviewed that report. She has a large scar. There is some peri-infarct ischemia. I suspect that she's had an out-of-hospital MI at some time over the past many months or year. More recently she was again hospitalized briefly. There was some mild CHF. I have reviewed the hospital records for this admission also. She is now on a higher dose of diuretics. She's not having any chest pain or shortness of breath today.  Allergies  Allergen Reactions  . Codeine     REACTION: nausea  . Lisinopril     REACTION: cough  . Propoxyphene-Acetaminophen   . Prednisone Other (See Comments)    irritability  . Statins Other (See Comments)    myalgias    Current Outpatient Prescriptions  Medication Sig Dispense Refill  . albuterol (PROAIR HFA) 108 (90 BASE) MCG/ACT inhaler Inhale 2 puffs into the lungs every 4 (four) hours as needed.      Marland Kitchen aspirin 81 MG tablet Take 81 mg by mouth daily.        . cinacalcet (SENSIPAR) 30 MG tablet Take 30 mg by mouth daily.       . diphenhydrAMINE (BENADRYL) 25 MG tablet Take 25 mg by mouth at bedtime as needed.      . Fe Fum-FA-B Cmp-C-Zn-Mg-Mn-Cu (HEMATINIC PLUS COMPLEX PO) Take 1 tablet by mouth daily.      . fesoterodine (TOVIAZ) 4 MG TB24 Take 2 mg by mouth daily.       . fexofenadine (ALLEGRA) 180 MG tablet Take 180 mg by mouth daily.        . fluticasone (FLONASE) 50 MCG/ACT nasal spray Place 2 sprays into the nose daily.        . furosemide (LASIX) 40 MG tablet Take 80 mg by mouth daily.      Marland Kitchen guaiFENesin (MUCINEX) 600 MG 12 hr tablet Take 600 mg by mouth as needed.       . isosorbide mononitrate (IMDUR) 30 MG 24 hr tablet Take 1 tablet by mouth daily.      Marland Kitchen  levothyroxine (SYNTHROID, LEVOTHROID) 88 MCG tablet Take 88 mcg by mouth daily.        . metoprolol (LOPRESSOR) 100 MG tablet Take 100 mg by mouth 2 (two) times daily.      . Multiple Vitamins-Minerals (ICAPS PO) Take 2 capsules by mouth daily.      . nitroGLYCERIN (NITROLINGUAL) 0.4 MG/SPRAY spray Place 1 spray under the tongue every 5 (five) minutes as needed.        Docia Barrier IN Inhale into the lungs as directed.      . potassium chloride (KLOR-CON) 10 MEQ CR tablet Take 10 mEq by mouth daily.         No current facility-administered medications for this visit.    History   Social History  . Marital Status: Married    Spouse Name: N/A    Number of Children: N/A  . Years of Education: N/A   Occupational History  . Not on file.   Social History Main Topics  . Smoking status: Never Smoker   . Smokeless tobacco: Never Used  . Alcohol Use: No  .  Drug Use: No  . Sexually Active: Not on file   Other Topics Concern  . Not on file   Social History Narrative  . No narrative on file    Family History  Problem Relation Age of Onset  . Heart failure Brother   . Heart failure Mother     Past Medical History  Diagnosis Date  . CAD (coronary artery disease)     PCI for MI, 2007  . Ejection fraction     EF 60-65%, echo, 08/2008  . PAD (peripheral artery disease)     of lower extremity  . Renal mass     right, biopsied in the past  . Dyslipidemia   . Hypothyroidism   . Anemia     normocytic  . Atrial fibrillation     permanent  . Carpal tunnel syndrome   . History of vertigo   . DDD (degenerative disc disease), cervical   . Syncopal episodes   . Aortic valve sclerosis     aortic valve thickening without stenosis  . CVA (cerebral vascular accident)     in hospital 11/2009  . Edema   . Other accident     mechanical fall with ruptured globe  . Pacemaker     Medtronic, brady/tachy-falls, 04/2010  . Carotid artery disease     doppler 09/2009, less than 50%  bilaterally  . Hypertension   . Warfarin anticoagulation     stopped after fall 03/2010, patient declined to restart  . Vertigo   . Sick sinus syndrome     multiple falls, PTVDP 04/2010  . ACE-inhibitor cough     02/2010  . MR (mitral regurgitation)     mild/ mod  . Abnormal x-ray     chest 04/2010 /   Chest x-ray August, 2012, no active disease  . CKD (chronic kidney disease)   . Pacemaker     July, 2011 for multiple falls with sick sinus syndrome  . Pulmonary hypertension   . HLD (hyperlipidemia)     Past Surgical History  Procedure Laterality Date  . Pacemaker insertion      Medtronic    Patient Active Problem List   Diagnosis Date Noted  . Chronic systolic heart failure 02/11/2013  . Ischemic cardiomyopathy 02/11/2013  . Pulmonary hypertension   . HLD (hyperlipidemia)   . Pacemaker-Medtronic   . Abnormal x-ray   . CAD (coronary artery disease)   . Ejection fraction   . PAD (peripheral artery disease)   . Renal mass   . Dyslipidemia   . Hypothyroidism   . Anemia   . Atrial fibrillation   . Carpal tunnel syndrome   . History of vertigo   . Syncopal episodes   . Aortic valve sclerosis   . Edema   . Carotid artery disease   . Hypertension   . Warfarin anticoagulation   . Sick sinus syndrome   . ACE-inhibitor cough   . MR (mitral regurgitation)   . CKD (chronic kidney disease)     ROS   Patient denies fever, chills, headache, sweats, rash, change in vision, change in hearing, chest pain, cough, nausea vomiting, urinary symptoms. All other systems are reviewed and are negative.  PHYSICAL EXAM   Patient is here with her daughter and a helper. Her daughter has just come out of rehabilitation after second knee replacement. Patient is oriented to person area there is no jugulovenous distention. Lungs are clear. Respiratory effort is nonlabored. Cardiac exam reveals S1 and S2. There  no clicks or significant murmurs. Abdomen is soft. She has no significant peripheral  edema today.  Filed Vitals:   03/28/13 1420  BP: 121/78  Pulse: 74  Height: 5' 2.5" (1.588 m)  Weight: 128 lb (58.06 kg)  SpO2: 92%     ASSESSMENT & PLAN

## 2013-03-28 NOTE — Assessment & Plan Note (Signed)
There is mitral regurgitation. This is being treated medically.  As part of today's evaluation I spent greater than 25 minutes with her total care. More than half of this time is been with direct contact with her and her daughter discussing her current situation and her recent hospitalization.

## 2013-03-28 NOTE — Assessment & Plan Note (Signed)
Her most recent scan February 13, 2013 done by nuclear evaluation showed that she does have a large inferolateral scar. There is some peri-infarct ischemia. I feel it is not appropriate to consider catheterization at this time.

## 2013-03-28 NOTE — Assessment & Plan Note (Signed)
She has atrial fibrillation with controlled rate.

## 2013-03-28 NOTE — Assessment & Plan Note (Signed)
At this time her volume status is stable. I'm hesitant with her renal function to consider spironolactone as of today. We will arrange for her renal function to be checked by a nurse who can draw it.

## 2013-04-26 ENCOUNTER — Telehealth: Payer: Self-pay | Admitting: *Deleted

## 2013-04-26 NOTE — Telephone Encounter (Signed)
Message copied by Eustace Moore on Fri Apr 26, 2013  4:33 PM ------      Message from: Eustace Moore      Created: Fri Apr 26, 2013  4:25 PM                   ----- Message -----         From: Prescott Parma, PA-C         Sent: 04/26/2013   4:16 PM           To: Lesle Chris, LPN            Creatinine up to 2.1. Please call pt to see if she has developed sxs of CHF. Recommend repeat BMET on Monday, 7/14, but add BNP. ------

## 2013-04-26 NOTE — Telephone Encounter (Signed)
Per daughter of patient, patient was seen by Dr. Sherril Croon yesterday and had lasix increased due to ankle swelling and also patient has appointment next Tuesday 04/30/13 with Nephrologist. Per Gene, cancel previous listed orders on patient. Daughter informed.

## 2013-05-08 DIAGNOSIS — M79609 Pain in unspecified limb: Secondary | ICD-10-CM

## 2013-05-09 DIAGNOSIS — I214 Non-ST elevation (NSTEMI) myocardial infarction: Secondary | ICD-10-CM

## 2013-05-10 DIAGNOSIS — I2510 Atherosclerotic heart disease of native coronary artery without angina pectoris: Secondary | ICD-10-CM

## 2013-05-10 DIAGNOSIS — I5023 Acute on chronic systolic (congestive) heart failure: Secondary | ICD-10-CM

## 2013-06-17 DEATH — deceased

## 2013-12-04 ENCOUNTER — Encounter: Payer: Self-pay | Admitting: *Deleted

## 2013-12-20 ENCOUNTER — Telehealth: Payer: Self-pay | Admitting: Internal Medicine

## 2013-12-20 NOTE — Telephone Encounter (Signed)
Per pt's daughter Arline AspCindy pt passed away in August 2014/mt
